# Patient Record
Sex: Male | Born: 1940 | Race: Black or African American | Hispanic: No | Marital: Married | Smoking: Never smoker
Health system: Southern US, Community
[De-identification: ages and names within clinical notes are randomized; demographics above are authoritative.]

## PROBLEM LIST (undated history)

## (undated) DIAGNOSIS — R519 Headache, unspecified: Secondary | ICD-10-CM

## (undated) DIAGNOSIS — M4854XA Collapsed vertebra, not elsewhere classified, thoracic region, initial encounter for fracture: Secondary | ICD-10-CM

## (undated) DIAGNOSIS — N400 Enlarged prostate without lower urinary tract symptoms: Secondary | ICD-10-CM

## (undated) DIAGNOSIS — D471 Chronic myeloproliferative disease: Secondary | ICD-10-CM

## (undated) DIAGNOSIS — M542 Cervicalgia: Secondary | ICD-10-CM

## (undated) DIAGNOSIS — D75839 Thrombocytosis, unspecified: Secondary | ICD-10-CM

## (undated) DIAGNOSIS — G629 Polyneuropathy, unspecified: Secondary | ICD-10-CM

## (undated) DIAGNOSIS — K219 Gastro-esophageal reflux disease without esophagitis: Secondary | ICD-10-CM

## (undated) DIAGNOSIS — R42 Dizziness and giddiness: Secondary | ICD-10-CM

## (undated) DIAGNOSIS — D45 Polycythemia vera: Secondary | ICD-10-CM

## (undated) DIAGNOSIS — I10 Essential (primary) hypertension: Secondary | ICD-10-CM

## (undated) DIAGNOSIS — F4310 Post-traumatic stress disorder, unspecified: Secondary | ICD-10-CM

## (undated) HISTORY — DX: Chronic myeloproliferative disease: D47.1

## (undated) HISTORY — PX: JOINT REPLACEMENT: SHX530

## (undated) HISTORY — DX: Headache, unspecified: R51.9

## (undated) HISTORY — PX: BACK SURGERY: SHX140

## (undated) HISTORY — DX: Benign prostatic hyperplasia without lower urinary tract symptoms: N40.0

## (undated) HISTORY — DX: Dizziness and giddiness: R42

## (undated) HISTORY — DX: Cervicalgia: M54.2

## (undated) HISTORY — DX: Post-traumatic stress disorder, unspecified: F43.10

## (undated) HISTORY — DX: Thrombocytosis, unspecified: D75.839

## (undated) HISTORY — DX: Collapsed vertebra, not elsewhere classified, thoracic region, initial encounter for fracture: M48.54XA

---

## 2006-10-20 ENCOUNTER — Ambulatory Visit: Payer: Self-pay | Admitting: Internal Medicine

## 2006-11-03 ENCOUNTER — Ambulatory Visit: Payer: Self-pay | Admitting: Internal Medicine

## 2006-11-03 ENCOUNTER — Encounter: Payer: Self-pay | Admitting: Internal Medicine

## 2006-12-19 ENCOUNTER — Encounter: Admission: RE | Admit: 2006-12-19 | Discharge: 2006-12-19 | Payer: Self-pay

## 2007-06-06 ENCOUNTER — Emergency Department (HOSPITAL_COMMUNITY): Admission: EM | Admit: 2007-06-06 | Discharge: 2007-06-06 | Payer: Self-pay | Admitting: Emergency Medicine

## 2007-09-15 ENCOUNTER — Emergency Department (HOSPITAL_COMMUNITY): Admission: EM | Admit: 2007-09-15 | Discharge: 2007-09-16 | Payer: Self-pay | Admitting: Emergency Medicine

## 2008-04-12 ENCOUNTER — Encounter: Admission: RE | Admit: 2008-04-12 | Discharge: 2008-04-12 | Payer: Self-pay | Admitting: *Deleted

## 2010-01-25 ENCOUNTER — Encounter: Payer: Self-pay | Admitting: *Deleted

## 2010-02-15 ENCOUNTER — Emergency Department (HOSPITAL_COMMUNITY)
Admission: EM | Admit: 2010-02-15 | Discharge: 2010-02-15 | Disposition: A | Discharge status: Home / Self Care | Payer: Non-veteran care | Attending: Emergency Medicine | Admitting: Emergency Medicine

## 2010-02-15 ENCOUNTER — Emergency Department (HOSPITAL_COMMUNITY): Payer: Non-veteran care

## 2010-02-15 DIAGNOSIS — M25569 Pain in unspecified knee: Secondary | ICD-10-CM | POA: Insufficient documentation

## 2010-02-15 DIAGNOSIS — M545 Low back pain, unspecified: Secondary | ICD-10-CM | POA: Insufficient documentation

## 2010-02-15 DIAGNOSIS — W010XXA Fall on same level from slipping, tripping and stumbling without subsequent striking against object, initial encounter: Secondary | ICD-10-CM | POA: Insufficient documentation

## 2010-02-15 DIAGNOSIS — I10 Essential (primary) hypertension: Secondary | ICD-10-CM | POA: Insufficient documentation

## 2010-02-15 DIAGNOSIS — Y92009 Unspecified place in unspecified non-institutional (private) residence as the place of occurrence of the external cause: Secondary | ICD-10-CM | POA: Insufficient documentation

## 2010-02-15 DIAGNOSIS — D45 Polycythemia vera: Secondary | ICD-10-CM | POA: Insufficient documentation

## 2010-02-15 DIAGNOSIS — R51 Headache: Secondary | ICD-10-CM | POA: Insufficient documentation

## 2010-02-15 DIAGNOSIS — S0990XA Unspecified injury of head, initial encounter: Secondary | ICD-10-CM | POA: Insufficient documentation

## 2010-02-15 DIAGNOSIS — M549 Dorsalgia, unspecified: Secondary | ICD-10-CM | POA: Insufficient documentation

## 2010-02-15 DIAGNOSIS — M25519 Pain in unspecified shoulder: Secondary | ICD-10-CM | POA: Insufficient documentation

## 2010-10-01 LAB — URINALYSIS, ROUTINE W REFLEX MICROSCOPIC
Glucose, UA: NEGATIVE
Ketones, ur: NEGATIVE
Leukocytes, UA: NEGATIVE
Nitrite: NEGATIVE
Specific Gravity, Urine: 1.015
pH: 6.5

## 2010-10-01 LAB — DIFFERENTIAL
Basophils Absolute: 0
Basophils Relative: 0
Eosinophils Absolute: 0.1
Lymphs Abs: 0.8
Monocytes Absolute: 0.5
Monocytes Relative: 6
Neutro Abs: 6.3

## 2010-10-01 LAB — CBC
Hemoglobin: 15.9
WBC: 7.6

## 2010-10-01 LAB — POCT I-STAT, CHEM 8
BUN: 9
Calcium, Ion: 1.14
Chloride: 104
Glucose, Bld: 98
HCT: 49
Potassium: 3.4 — ABNORMAL LOW

## 2010-10-01 LAB — POCT CARDIAC MARKERS
CKMB, poc: 7.6
Troponin i, poc: <0.05

## 2011-03-31 ENCOUNTER — Telehealth: Payer: Self-pay | Admitting: *Deleted

## 2011-03-31 NOTE — Telephone Encounter (Signed)
Opened in error

## 2011-07-09 ENCOUNTER — Encounter (HOSPITAL_COMMUNITY): Payer: Self-pay | Admitting: *Deleted

## 2011-07-09 ENCOUNTER — Emergency Department (HOSPITAL_COMMUNITY)
Admission: EM | Admit: 2011-07-09 | Discharge: 2011-07-10 | Disposition: A | Discharge status: Home / Self Care | Payer: Non-veteran care | Attending: Emergency Medicine | Admitting: Emergency Medicine

## 2011-07-09 ENCOUNTER — Emergency Department (HOSPITAL_COMMUNITY): Payer: Non-veteran care

## 2011-07-09 DIAGNOSIS — R079 Chest pain, unspecified: Secondary | ICD-10-CM | POA: Insufficient documentation

## 2011-07-09 DIAGNOSIS — S2239XA Fracture of one rib, unspecified side, initial encounter for closed fracture: Secondary | ICD-10-CM | POA: Insufficient documentation

## 2011-07-09 DIAGNOSIS — I10 Essential (primary) hypertension: Secondary | ICD-10-CM | POA: Insufficient documentation

## 2011-07-09 DIAGNOSIS — S2231XA Fracture of one rib, right side, initial encounter for closed fracture: Secondary | ICD-10-CM

## 2011-07-09 DIAGNOSIS — S93409A Sprain of unspecified ligament of unspecified ankle, initial encounter: Secondary | ICD-10-CM

## 2011-07-09 DIAGNOSIS — M25579 Pain in unspecified ankle and joints of unspecified foot: Secondary | ICD-10-CM | POA: Insufficient documentation

## 2011-07-09 HISTORY — DX: Essential (primary) hypertension: I10

## 2011-07-09 MED ORDER — IBUPROFEN 800 MG PO TABS
800.0000 mg | ORAL_TABLET | Freq: Once | ORAL | Status: AC
  Administered 2011-07-09: 800 mg via ORAL
  Filled 2011-07-09: qty 1

## 2011-07-09 NOTE — ED Notes (Signed)
Patient transported to X-ray 
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
## 2011-07-09 NOTE — ED Notes (Signed)
EAV:[id number]> Expected date:07/09/11<BR> Expected time:10:42 PM<BR> Means of arrival:Ambulance<BR> Comments:<BR> Anxiety; abd pain

## 2011-07-10 ENCOUNTER — Emergency Department (HOSPITAL_COMMUNITY): Payer: Non-veteran care

## 2011-07-10 MED ORDER — IBUPROFEN 800 MG PO TABS: 800.0000 mg | ORAL_TABLET | Freq: Three times a day (TID) | ORAL | Status: DC

## 2011-07-10 MED ORDER — HYDROCODONE-ACETAMINOPHEN 5-500 MG PO TABS: 1.0000 | ORAL_TABLET | Freq: Four times a day (QID) | ORAL | Status: DC | PRN

## 2011-07-10 NOTE — ED Notes (Signed)
EKG was complete at 22:58

## 2011-07-12 ENCOUNTER — Encounter (HOSPITAL_COMMUNITY): Payer: Self-pay | Admitting: *Deleted

## 2011-07-12 ENCOUNTER — Emergency Department (HOSPITAL_COMMUNITY): Payer: No Typology Code available for payment source

## 2011-07-12 ENCOUNTER — Emergency Department (HOSPITAL_COMMUNITY)
Admission: EM | Admit: 2011-07-12 | Discharge: 2011-07-13 | Disposition: A | Discharge status: Home / Self Care | Payer: No Typology Code available for payment source | Attending: Emergency Medicine | Admitting: Emergency Medicine

## 2011-07-12 DIAGNOSIS — J9819 Other pulmonary collapse: Secondary | ICD-10-CM | POA: Insufficient documentation

## 2011-07-12 DIAGNOSIS — J9811 Atelectasis: Secondary | ICD-10-CM

## 2011-07-12 DIAGNOSIS — I10 Essential (primary) hypertension: Secondary | ICD-10-CM | POA: Insufficient documentation

## 2011-07-12 DIAGNOSIS — R0602 Shortness of breath: Secondary | ICD-10-CM | POA: Diagnosis present

## 2011-07-12 DIAGNOSIS — R079 Chest pain, unspecified: Secondary | ICD-10-CM | POA: Insufficient documentation

## 2011-07-12 MED ORDER — OXYCODONE-ACETAMINOPHEN 5-325 MG PO TABS
2.0000 | ORAL_TABLET | Freq: Once | ORAL | Status: AC
  Administered 2011-07-12: 2 via ORAL
  Filled 2011-07-12: qty 2
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
## 2011-07-13 MED ORDER — OXYCODONE-ACETAMINOPHEN 5-325 MG PO TABS: 1.0000 | ORAL_TABLET | ORAL | Status: AC | PRN
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
## 2011-07-13 NOTE — Progress Notes (Signed)
Incentive spirometer instructions given, Pt stable at discharge. No issues. Prescriptions reviewed

## 2011-07-30 ENCOUNTER — Encounter (HOSPITAL_COMMUNITY): Payer: Self-pay | Admitting: *Deleted

## 2011-07-30 ENCOUNTER — Emergency Department (HOSPITAL_COMMUNITY)
Admission: EM | Admit: 2011-07-30 | Discharge: 2011-07-30 | Disposition: A | Discharge status: Home / Self Care | Payer: No Typology Code available for payment source | Attending: Emergency Medicine | Admitting: Emergency Medicine

## 2011-07-30 ENCOUNTER — Emergency Department (HOSPITAL_COMMUNITY): Payer: No Typology Code available for payment source

## 2011-07-30 DIAGNOSIS — Z79899 Other long term (current) drug therapy: Secondary | ICD-10-CM | POA: Diagnosis not present

## 2011-07-30 DIAGNOSIS — R079 Chest pain, unspecified: Secondary | ICD-10-CM | POA: Insufficient documentation

## 2011-07-30 DIAGNOSIS — Z7982 Long term (current) use of aspirin: Secondary | ICD-10-CM | POA: Insufficient documentation

## 2011-07-30 DIAGNOSIS — I10 Essential (primary) hypertension: Secondary | ICD-10-CM | POA: Insufficient documentation

## 2011-07-30 MED ORDER — GI COCKTAIL ~~LOC~~
30.0000 mL | Freq: Once | ORAL | Status: AC
  Administered 2011-07-30: 30 mL via ORAL
  Filled 2011-07-30: qty 30

## 2011-07-30 MED ORDER — PANTOPRAZOLE SODIUM 40 MG PO TBEC: 40.0000 mg | DELAYED_RELEASE_TABLET | Freq: Every day | ORAL | Status: AC

## 2011-07-30 MED ORDER — OXYCODONE-ACETAMINOPHEN 5-325 MG PO TABS
2.0000 | ORAL_TABLET | Freq: Once | ORAL | Status: AC
  Administered 2011-07-30: 2 via ORAL
  Filled 2011-07-30: qty 2
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
## 2011-07-30 NOTE — ED Notes (Signed)
Pt also reports he has been belching "alot, like my food gets stuck or something."
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
## 2011-08-10 ENCOUNTER — Other Ambulatory Visit (HOSPITAL_COMMUNITY): Payer: Self-pay | Admitting: Chiropractic Medicine

## 2011-08-10 DIAGNOSIS — S43429A Sprain of unspecified rotator cuff capsule, initial encounter: Secondary | ICD-10-CM

## 2011-08-11 ENCOUNTER — Ambulatory Visit (HOSPITAL_COMMUNITY)
Admission: RE | Admit: 2011-08-11 | Discharge: 2011-08-11 | Disposition: A | Discharge status: Home / Self Care | Payer: No Typology Code available for payment source | Source: Ambulatory Visit | Attending: Chiropractic Medicine | Admitting: Chiropractic Medicine

## 2011-08-11 DIAGNOSIS — M67919 Unspecified disorder of synovium and tendon, unspecified shoulder: Secondary | ICD-10-CM | POA: Insufficient documentation

## 2011-08-11 DIAGNOSIS — M719 Bursopathy, unspecified: Secondary | ICD-10-CM | POA: Insufficient documentation

## 2011-08-11 DIAGNOSIS — M24119 Other articular cartilage disorders, unspecified shoulder: Secondary | ICD-10-CM | POA: Insufficient documentation

## 2011-08-11 DIAGNOSIS — M25519 Pain in unspecified shoulder: Secondary | ICD-10-CM | POA: Insufficient documentation

## 2011-08-11 DIAGNOSIS — S43429A Sprain of unspecified rotator cuff capsule, initial encounter: Secondary | ICD-10-CM

## 2011-08-11 DIAGNOSIS — R29898 Other symptoms and signs involving the musculoskeletal system: Secondary | ICD-10-CM | POA: Insufficient documentation

## 2011-08-12 ENCOUNTER — Inpatient Hospital Stay (HOSPITAL_COMMUNITY)
Admission: RE | Admit: 2011-08-12 | Discharge: 2011-08-12 | Payer: Non-veteran care | Source: Ambulatory Visit | Attending: Chiropractic Medicine | Admitting: Chiropractic Medicine

## 2011-09-20 ENCOUNTER — Encounter: Payer: Self-pay | Admitting: Internal Medicine

## 2012-05-04 ENCOUNTER — Encounter: Payer: Self-pay | Admitting: Internal Medicine
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
## 2015-04-12 ENCOUNTER — Encounter (HOSPITAL_COMMUNITY): Payer: Self-pay | Admitting: Oncology

## 2015-04-12 ENCOUNTER — Emergency Department (HOSPITAL_COMMUNITY): Payer: Non-veteran care

## 2015-04-12 ENCOUNTER — Emergency Department (HOSPITAL_COMMUNITY)
Admission: EM | Admit: 2015-04-12 | Discharge: 2015-04-12 | Disposition: A | Discharge status: Home / Self Care | Payer: Non-veteran care | Attending: Emergency Medicine | Admitting: Emergency Medicine

## 2015-04-12 DIAGNOSIS — M4806 Spinal stenosis, lumbar region: Secondary | ICD-10-CM | POA: Insufficient documentation

## 2015-04-12 DIAGNOSIS — Z7982 Long term (current) use of aspirin: Secondary | ICD-10-CM | POA: Insufficient documentation

## 2015-04-12 DIAGNOSIS — Z79899 Other long term (current) drug therapy: Secondary | ICD-10-CM | POA: Insufficient documentation

## 2015-04-12 DIAGNOSIS — I10 Essential (primary) hypertension: Secondary | ICD-10-CM | POA: Insufficient documentation

## 2015-04-12 DIAGNOSIS — R202 Paresthesia of skin: Secondary | ICD-10-CM | POA: Insufficient documentation

## 2015-04-12 DIAGNOSIS — R200 Anesthesia of skin: Secondary | ICD-10-CM | POA: Insufficient documentation

## 2015-04-12 DIAGNOSIS — M48061 Spinal stenosis, lumbar region without neurogenic claudication: Secondary | ICD-10-CM

## 2015-04-12 NOTE — ED Provider Notes (Signed)
CSN: [id number]     Arrival date & time 04/12/15  1921 History   First MD Initiated Contact with Patient 04/12/15 2120     Chief Complaint  Patient presents with  . Right foot numbness      (Consider location/radiation/quality/duration/timing/severity/associated sxs/prior Treatment) HPI Comments: This is 75 year old male who states for the past week he has had numbness and tingling in his right foot at the base of the fourth and fifth toes that is spreading to the lateral foot and up into his shin.  He was seen by the [facility] last week and set up with an appointment with the podiatrist for June 5.  They told him to take a bottle of water and roll his foot over it to help exercises.  He does have a history of plantar fasciitis in his left foot and this feels different.  He also states that several days ago he had pain in his buttock and hip. He denies any trauma, is not a diabetic.  He does not have any ingrown nails  The history is provided by the patient.    Past Medical History  Diagnosis Date  . Hypertension    History reviewed. No pertinent past surgical history. No family history on file. Social History  Substance Use Topics  . Smoking status: Never Smoker   . Smokeless tobacco: None  . Alcohol Use: No    Review of Systems  Constitutional: Negative for fever.  Respiratory: Negative for shortness of breath.   Cardiovascular: Negative for chest pain.  Gastrointestinal: Negative for abdominal pain.  Genitourinary: Negative for dysuria.  Musculoskeletal: Negative for back pain.  Skin: Negative for rash and wound.  Neurological: Positive for numbness.  All other systems reviewed and are negative.     Allergies  Review of patient's allergies indicates no known allergies.  Home Medications   Prior to Admission medications   Medication Sig Start Date End Date Taking? Authorizing Provider  acetaminophen (TYLENOL) 500 MG tablet Take 1,000 mg by mouth 2 (two) times daily as  needed. For pain.    Historical Provider, MD  aspirin 325 MG tablet Take 325 mg by mouth daily.    Historical Provider, MD  cholecalciferol (VITAMIN D) 1000 UNITS tablet Take 1,000 Units by mouth daily.    Historical Provider, MD  docusate sodium (COLACE) 100 MG capsule Take 200 mg by mouth 2 (two) times daily.    Historical Provider, MD  hydrochlorothiazide (HYDRODIURIL) 25 MG tablet Take 25 mg by mouth daily.    Historical Provider, MD  hydroxyurea (HYDREA) 500 MG capsule Take 500-1,000 mg by mouth daily. May take with food to minimize GI side effects; 1 cap daily except for 2 caps daily on Saturday and Sunday.    Historical Provider, MD  Multiple Vitamin (MULTIVITAMIN WITH MINERALS) TABS Take 1 tablet by mouth daily.    Historical Provider, MD  NIFEdipine (PROCARDIA XL/ADALAT-CC) 60 MG 24 hr tablet Take 60 mg by mouth daily.    Historical Provider, MD  oxyCODONE-acetaminophen (PERCOCET/ROXICET) 5-325 MG per tablet Take 1 tablet by mouth every 4 (four) hours as needed. For pain.    Historical Provider, MD  pantoprazole (PROTONIX) 40 MG tablet Take 1 tablet (40 mg total) by mouth daily. 07/30/11 07/29/12  Virgel Manifold, MD   BP 135/75 mmHg  Pulse 88  Temp(Src) 97.8 F (36.6 C) (Oral)  Resp 18  SpO2 100% Physical Exam  Constitutional: He is oriented to person, place, and time. He appears well-developed and well-nourished.  HENT:  Head: Normocephalic.  Eyes: Pupils are equal, round, and reactive to light.  Neck: Normal range of motion.  Cardiovascular: Normal rate and regular rhythm.   Pulmonary/Chest: Effort normal and breath sounds normal.  Abdominal: Soft. He exhibits no distension. There is no tenderness.  Musculoskeletal: He exhibits no edema or tenderness.  Neurological: He is alert and oriented to person, place, and time. He has normal strength. He displays a negative Romberg sign.  Decreased sensation lateral R foot radiating to mid calf.  No change in temperature of the skin.  No  rash, discoloration   Skin: Skin is warm.  Psychiatric: He has a normal mood and affect.  Nursing note and vitals reviewed.   ED Course  Procedures (including critical care time) Labs Review Labs Reviewed - No data to display  Imaging Review Ct Lumbar Spine Wo Contrast  04/12/2015  CLINICAL DATA:  Low back pain radiating to feet for 1 week. EXAM: CT LUMBAR SPINE WITHOUT CONTRAST TECHNIQUE: Multidetector CT imaging of the lumbar spine was performed without intravenous contrast administration. Multiplanar CT image reconstructions were also generated. COMPARISON:  Lumbar spine radiographs February 12th 2012 and MRI of the lumbar spine April 12, 2008 FINDINGS: Using previously described reference levels, moderate to severe stable T 12 and moderate L1 superior endplate compression fractures. The remaining vertebral bodies are intact with maintenance of lumbar lordosis. Mild broad levoscoliosis on this nonweightbearing examination. Stable grade 1 L3-4 anterolisthesis. No spondylolysis. Severe L3-4 and L5-S1 disc height loss with vacuum disc, moderate at L4-5 compatible with degenerative discs. No destructive bony lesions. Mild calcific atherosclerosis of the included aortoiliac vessels. Level by level evaluation: T12 and L1: No significant disc bulge, canal stenosis or osseous neural foraminal narrowing. L2-3: Moderate broad-based disc osteophyte complex. Moderate facet arthropathy and ligamentum flavum redundancy. Similar moderate canal stenosis and mild bilateral neural foraminal narrowing. L3-4: Anterolisthesis. Moderate broad-based disc osteophyte complex. Moderate to severe facet arthropathy and ligamentum flavum redundancy result in severe canal stenosis, similar to prior MRI. Moderate to severe RIGHT, mild LEFT neural foraminal narrowing. L4-5: Status post RIGHT hemilaminectomy. Small broad-based disc osteophyte complex and mild facet arthropathy without canal stenosis. Moderate RIGHT, mild to moderate  LEFT neural foraminal narrowing. L5-S1: Small broad-based disc osteophyte complex. Mild facet arthropathy and ligamentum flavum redundancy without canal stenosis. Moderate RIGHT, severe LEFT neural foraminal narrowing. IMPRESSION: No acute lumbar spine fracture. Old moderate to severe T12 and moderate L1 compression fractures. Stable grade 1 L3-4 anterolisthesis on degenerative basis. Likely component of adjacent segment disease considering RIGHT L4 hemilaminectomy. Similar severe canal stenosis at L3-4, moderate at L2-3. Similar neural foraminal narrowing L2-3 through L5-S1: Severe on the LEFT at L5-S1. Electronically Signed   By: Elon Alas M.D.   On: 04/12/2015 22:30   I have personally reviewed and evaluated these images and lab results as part of my medical decision-making.   EKG Interpretation None     Based on the chronicity of the above CT scan readings.  Patient has been referred back to his [place] doctor MDM   Final diagnoses:  Spinal stenosis of lumbar region at multiple levels  Paresthesia of right foot         Junius Creamer, NP 04/12/15 2256  Lacretia Leigh, MD 04/16/15 [phone]

## 2015-04-12 NOTE — ED Notes (Signed)
Pt c/o right foot numbness that is beginning to radiate up his calf x 1 week.  Was seen at the [facility] last week and set up w/ an appointment for a podiatrist on June 5th.  Pt reports no falls and ambulating at baseline.  Rates pain 8/10.

## 2015-04-12 NOTE — Discharge Instructions (Signed)
The CT scan of your back shows that you have extensive spinal stenosis at multiple levels.  This is been compared to a previous MRI and CT scan that you've had.  There are no fractures.  Please make an appointment with your physician at the [facility] for further evaluation.

## 2016-05-22 ENCOUNTER — Emergency Department (HOSPITAL_COMMUNITY): Payer: Non-veteran care

## 2016-05-22 ENCOUNTER — Emergency Department (HOSPITAL_COMMUNITY)
Admission: EM | Admit: 2016-05-22 | Discharge: 2016-05-23 | Disposition: A | Discharge status: Home / Self Care | Payer: Non-veteran care | Attending: Emergency Medicine | Admitting: Emergency Medicine

## 2016-05-22 ENCOUNTER — Encounter (HOSPITAL_COMMUNITY): Payer: Self-pay | Admitting: Oncology

## 2016-05-22 DIAGNOSIS — K3184 Gastroparesis: Secondary | ICD-10-CM | POA: Diagnosis not present

## 2016-05-22 DIAGNOSIS — I10 Essential (primary) hypertension: Secondary | ICD-10-CM | POA: Insufficient documentation

## 2016-05-22 DIAGNOSIS — Z96651 Presence of right artificial knee joint: Secondary | ICD-10-CM | POA: Insufficient documentation

## 2016-05-22 DIAGNOSIS — Z79899 Other long term (current) drug therapy: Secondary | ICD-10-CM | POA: Insufficient documentation

## 2016-05-22 DIAGNOSIS — R109 Unspecified abdominal pain: Secondary | ICD-10-CM | POA: Diagnosis present

## 2016-05-22 DIAGNOSIS — R1084 Generalized abdominal pain: Secondary | ICD-10-CM

## 2016-05-22 HISTORY — DX: Gastro-esophageal reflux disease without esophagitis: K21.9

## 2016-05-22 LAB — CBC
HEMATOCRIT: 36.3 % — AB (ref 39.0–52.0)
Hemoglobin: 11.5 g/dL — ABNORMAL LOW (ref 13.0–17.0)
MCH: 26.9 pg (ref 26.0–34.0)
MCHC: 31.7 g/dL (ref 30.0–36.0)
MCV: 85 fL (ref 78.0–100.0)
Platelets: 487 10*3/uL — ABNORMAL HIGH (ref 150–400)
RBC: 4.27 MIL/uL (ref 4.22–5.81)
RDW: 17.6 % — AB (ref 11.5–15.5)
WBC: 7.4 10*3/uL (ref 4.0–10.5)

## 2016-05-22 LAB — I-STAT CHEM 8, ED
BUN: 8 mg/dL (ref 6–20)
CHLORIDE: 100 mmol/L — AB (ref 101–111)
CREATININE: 1 mg/dL (ref 0.61–1.24)
Calcium, Ion: 1.14 mmol/L — ABNORMAL LOW (ref 1.15–1.40)
GLUCOSE: 91 mg/dL (ref 65–99)
HCT: 36 % — ABNORMAL LOW (ref 39.0–52.0)
Hemoglobin: 12.2 g/dL — ABNORMAL LOW (ref 13.0–17.0)
POTASSIUM: 3.6 mmol/L (ref 3.5–5.1)
Sodium: 139 mmol/L (ref 135–145)
TCO2: 29 mmol/L (ref 0–100)

## 2016-05-22 LAB — COMPREHENSIVE METABOLIC PANEL
ALT: 14 U/L — ABNORMAL LOW (ref 17–63)
AST: 20 U/L (ref 15–41)
Albumin: 4.2 g/dL (ref 3.5–5.0)
Alkaline Phosphatase: 47 U/L (ref 38–126)
Anion gap: 8 (ref 5–15)
BUN: 7 mg/dL (ref 6–20)
CHLORIDE: 103 mmol/L (ref 101–111)
CO2: 27 mmol/L (ref 22–32)
Calcium: 9 mg/dL (ref 8.9–10.3)
Creatinine, Ser: 1.04 mg/dL (ref 0.61–1.24)
Glucose, Bld: 89 mg/dL (ref 65–99)
Potassium: 3.6 mmol/L (ref 3.5–5.1)
Sodium: 138 mmol/L (ref 135–145)
Total Bilirubin: 0.4 mg/dL (ref 0.3–1.2)
Total Protein: 6.3 g/dL — ABNORMAL LOW (ref 6.5–8.1)

## 2016-05-22 LAB — LIPASE, BLOOD: LIPASE: 26 U/L (ref 11–51)

## 2016-05-22 MED ORDER — SODIUM CHLORIDE 0.9 % IV BOLUS (SEPSIS)
1000.0000 mL | Freq: Once | INTRAVENOUS | Status: AC
  Administered 2016-05-22: 1000 mL via INTRAVENOUS

## 2016-05-22 MED ORDER — IOPAMIDOL (ISOVUE-300) INJECTION 61%
INTRAVENOUS | Status: AC
  Administered 2016-05-23
  Filled 2016-05-22: qty 100

## 2016-05-22 MED ORDER — METOCLOPRAMIDE HCL 5 MG/ML IJ SOLN
10.0000 mg | Freq: Once | INTRAMUSCULAR | Status: AC
  Administered 2016-05-22: 10 mg via INTRAVENOUS
  Filled 2016-05-22: qty 2

## 2016-05-22 MED ORDER — IOPAMIDOL (ISOVUE-300) INJECTION 61%
100.0000 mL | Freq: Once | INTRAVENOUS | Status: AC | PRN
  Administered 2016-05-22: 100 mL via INTRAVENOUS

## 2016-05-22 MED ORDER — SODIUM CHLORIDE 0.9 % IV SOLN
INTRAVENOUS | Status: DC
  Administered 2016-05-22: 22:00:00 via INTRAVENOUS

## 2016-05-22 NOTE — ED Notes (Signed)
Patient aware a urine sample is needed and has a urinal at bedside.

## 2016-05-22 NOTE — ED Provider Notes (Signed)
[name] DEPT Provider Note   CSN: [id number] Arrival date & time: 05/22/16  2101     History   Chief Complaint Chief Complaint  Patient presents with  . Abdominal Pain    HPI Steve Miller is a 76 y.o. male.  Pt presents to the ED today with abdominal pain, poor po intake, and weight loss.  The pt said that he feels like food gets stuck in his esophagus.  He has had a barium swallow that showed that the food does get stuck, but pt has not seen GI.        Past Medical History:  Diagnosis Date  . GERD (gastroesophageal reflux disease)   . Hypertension     There are no active problems to display for this patient.   Past Surgical History:  Procedure Laterality Date  . BACK SURGERY    . JOINT REPLACEMENT Right    knee       Home Medications    Prior to Admission medications   Medication Sig Start Date End Date Taking? Authorizing Provider  Ascorbic Acid (VITAMIN C PO) Take 1 tablet by mouth daily.   Yes [provider]  cholecalciferol (VITAMIN D) 1000 UNITS tablet Take 1,000 Units by mouth daily.   Yes [provider]  docusate sodium (COLACE) 100 MG capsule Take 200 mg by mouth 2 (two) times daily.   Yes [provider]  hydrochlorothiazide (HYDRODIURIL) 25 MG tablet Take 25 mg by mouth daily.   Yes [provider]  Multiple Vitamin (MULTIVITAMIN WITH MINERALS) TABS Take 1 tablet by mouth daily.   Yes [provider]  omeprazole (PRILOSEC) 40 MG capsule Take 40 mg by mouth daily.   Yes [provider]  pantoprazole (PROTONIX) 40 MG tablet Take 40 mg by mouth daily.   Yes [provider]  ruxolitinib phosphate (JAKAFI) 10 MG tablet Take 10 mg by mouth at bedtime.   Yes [provider]  ruxolitinib phosphate (JAKAFI) 20 MG tablet Take 20 mg by mouth daily.   Yes [provider]  tamsulosin (FLOMAX) 0.4 MG CAPS capsule Take 0.4 mg by mouth daily after supper.   Yes [provider]  acetaminophen (TYLENOL) 500 MG tablet Take 1,000 mg by mouth 2 (two) times daily as needed. For pain.    [provider]  metoCLOPramide (REGLAN) 10 MG tablet Take 1 tablet (10 mg total) by mouth every 6 (six) hours. 05/23/16   Isla Pence, MD  pantoprazole (PROTONIX) 40 MG tablet Take 1 tablet (40 mg total) by mouth daily. 07/30/11 07/29/12  Virgel Manifold, MD    Family History No family history on file.  Social History Social History  Substance Use Topics  . Smoking status: Never Smoker  . Smokeless tobacco: Never Used  . Alcohol use No     Allergies   Patient has no known allergies.   Review of Systems Review of Systems  Gastrointestinal: Positive for abdominal pain.  All other systems reviewed and are negative.    Physical Exam Updated Vital Signs BP (!) 144/80 (BP Location: Right Arm)   Pulse 66   Temp 98.2 F (36.8 C) (Oral)   Resp 11   Ht 5\' 7"  (1.702 m)   Wt 72.6 kg (160 lb)   SpO2 99%   BMI 25.06 kg/m   Physical Exam  Constitutional: He is oriented to person, place, and time. He appears well-developed and well-nourished.  HENT:  Head: Normocephalic and atraumatic.  Right Ear: External  ear normal.  Left Ear: External ear normal.  Nose: Nose normal.  Mouth/Throat: Oropharynx is clear and moist.  Eyes: Conjunctivae and EOM are normal. Pupils are equal, round, and reactive to light.  Neck: Normal range of motion. Neck supple.  Cardiovascular: Normal rate, regular rhythm, normal heart sounds and intact distal pulses.   Pulmonary/Chest: Effort normal and breath sounds normal.  Abdominal: Soft. Bowel sounds are normal. There is tenderness in the epigastric area.  Musculoskeletal: Normal range of motion.  Neurological: He is alert and oriented to person, place, and time.  Skin: Skin is warm.  Psychiatric: He has a normal mood and affect. His behavior is normal. Judgment and thought content normal.  Nursing note and vitals  reviewed.    ED Treatments / Results  Labs (all labs ordered are listed, but only abnormal results are displayed) Labs Reviewed  COMPREHENSIVE METABOLIC PANEL - Abnormal; Notable for the following:       Result Value   Total Protein 6.3 (*)    ALT 14 (*)    All other components within normal limits  CBC - Abnormal; Notable for the following:    Hemoglobin 11.5 (*)    HCT 36.3 (*)    RDW 17.6 (*)    Platelets 487 (*)    All other components within normal limits  I-STAT CHEM 8, ED - Abnormal; Notable for the following:    Chloride 100 (*)    Calcium, Ion 1.14 (*)    Hemoglobin 12.2 (*)    HCT 36.0 (*)    All other components within normal limits  LIPASE, BLOOD  URINALYSIS, ROUTINE W REFLEX MICROSCOPIC  TROPONIN I    EKG  EKG Interpretation  Date/Time:  Sunday May 23 2016 00:33:37 EDT Ventricular Rate:  69 PR Interval:    QRS Duration: 95 QT Interval:  422 QTC Calculation: 453 R Axis:   37 Text Interpretation:  Sinus rhythm Borderline low voltage, extremity leads No significant change since last tracing Confirmed by Ripley Fraise [phone]) on 05/23/2016 12:43:05 AM Also confirmed by Ripley Fraise [phone]), editor Drema Pry [phone])  on 05/23/2016 8:51:43 AM       Radiology No results found.  Procedures Procedures (including critical care time)  Medications Ordered in ED Medications  metoCLOPramide (REGLAN) injection 10 mg (10 mg Intravenous Given 05/22/16 2220)  sodium chloride 0.9 % bolus 1,000 mL (0 mLs Intravenous Stopped 05/22/16 2319)  iopamidol (ISOVUE-300) 61 % injection 100 mL (100 mLs Intravenous Contrast Given 05/22/16 2321)  iopamidol (ISOVUE-300) 61 % injection (  Contrast Given 05/23/16 0004)     Initial Impression / Assessment and Plan / ED Course  I have reviewed the triage vital signs and the nursing notes.  Pertinent labs & imaging results that were available during my care of the patient were reviewed by me and considered in my  medical decision making (see chart for details).    Pt signed out to Dr. Christy Gentles pending labs and CT results.  Pt knows to return if worse.  F/u with pcp and with GI.  Final Clinical Impressions(s) / ED Diagnoses   Final diagnoses:  Generalized abdominal pain  Gastroparesis    New Prescriptions Discharge Medication List as of 05/23/2016  1:47 AM    START taking these medications   Details  metoCLOPramide (REGLAN) 10 MG tablet Take 1 tablet (10 mg total) by mouth every 6 (six) hours., Starting Sun 05/23/2016, Print         Isla Pence, MD 05/25/16  1206  

## 2016-05-22 NOTE — ED Triage Notes (Signed)
Pt c/o abdominal pain, poor po intake, diarrhea, bad taste in his mouth, feels as if food gets stuck in epigastric area and 6-7 lb weight, loss over the last month.  Pt states he called the [facility] and was instructed to come to the ED.  Per pt he was in a rehab facility after a right knee replacement and had a barium swallow study done which showed that food was getting stuck and taking more time to reach his stomach.

## 2016-05-23 LAB — URINALYSIS, ROUTINE W REFLEX MICROSCOPIC
Bilirubin Urine: NEGATIVE
Glucose, UA: NEGATIVE mg/dL
Hgb urine dipstick: NEGATIVE
Ketones, ur: NEGATIVE mg/dL
LEUKOCYTES UA: NEGATIVE
NITRITE: NEGATIVE
Protein, ur: NEGATIVE mg/dL
SPECIFIC GRAVITY, URINE: 1.01 (ref 1.005–1.030)
pH: 7 (ref 5.0–8.0)

## 2016-05-23 LAB — TROPONIN I: Troponin I: <0.03 ng/mL (ref <0.03)

## 2016-05-23 MED ORDER — METOCLOPRAMIDE HCL 10 MG PO TABS: 10.0000 mg | ORAL_TABLET | Freq: Four times a day (QID) | ORAL | 0 refills | Status: AC

## 2016-05-23 NOTE — ED Provider Notes (Signed)
I assumed care at signout Imaging/labs unremarkable Pt well appearing He is requesting d/c home reglan ordered GI referral given    Ripley Fraise, MD 05/23/16 [phone]

## 2016-06-28 ENCOUNTER — Ambulatory Visit: Payer: Self-pay | Admitting: Podiatry

## 2016-07-12 ENCOUNTER — Ambulatory Visit (INDEPENDENT_AMBULATORY_CARE_PROVIDER_SITE_OTHER): Payer: Non-veteran care | Admitting: Podiatry

## 2016-07-12 ENCOUNTER — Encounter: Payer: Self-pay | Admitting: Podiatry

## 2016-07-12 ENCOUNTER — Ambulatory Visit (INDEPENDENT_AMBULATORY_CARE_PROVIDER_SITE_OTHER): Payer: Non-veteran care

## 2016-07-12 ENCOUNTER — Telehealth: Payer: Self-pay | Admitting: *Deleted

## 2016-07-12 VITALS — BP 149/86 | HR 63

## 2016-07-12 DIAGNOSIS — M722 Plantar fascial fibromatosis: Secondary | ICD-10-CM | POA: Diagnosis not present

## 2016-07-12 DIAGNOSIS — M792 Neuralgia and neuritis, unspecified: Secondary | ICD-10-CM | POA: Diagnosis not present

## 2016-07-12 DIAGNOSIS — R52 Pain, unspecified: Secondary | ICD-10-CM

## 2016-07-12 NOTE — Progress Notes (Addendum)
   Subjective:    Patient ID: Steve Miller, male    DOB: [birth year], 76 y.o.   MRN: [id number]  HPI 76 year old male presents the office today for concerns of left heel pain. He states his been going to the [facility] [place] for this last several years and he gets orthotics every 6 months and his her old and requesting new ones. He denies any recent injury or trauma. His pain in the heel after he is been on his feet. He has a states that he does get numbness and tingling to the right leg and foot. He denies any significant numbness or tingling to the left leg he states on the right-sided seems to be getting worse. Denies any recent injury or trauma to the area no swelling or redness. He has no other concerns otherwise.   Review of Systems  Constitutional: Positive for unexpected weight change.  HENT: Positive for sinus pressure.   Cardiovascular: Positive for leg swelling.  Gastrointestinal: Positive for constipation.  Musculoskeletal: Positive for gait problem.       Joint pain       Objective:   Physical Exam General: AAO x3, NAD  Dermatological: Skin is warm, dry and supple bilateral. Nails x 10 are well manicured; remaining integument appears unremarkable at this time. There are no open sores, no preulcerative lesions, no rash or signs of infection present.  Vascular: Dorsalis Pedis artery and Posterior Tibial artery pedal pulses are 2/4 bilateral with immedate capillary fill time. Pedal hair growth present. No varicosities and no lower extremity edema present bilateral. There is no pain with calf compression, swelling, warmth, erythema.   Neruologic: Grossly intact via light touch bilateral. Vibratory intact via tuning fork bilateral. Protective threshold with Semmes Wienstein monofilament intact to all pedal sites bilateral. Subjectively there is numbness and tingling to the right leg and shooting pain. Negative tinel sign bilaterally.    Musculoskeletal:  HAV is present bilaterally. On the left  side there is tenderness palpation of plantar medial tubercle of the calcaneus at insertion of plantar fascia. There is no pain along the course of the plantar fascia upon her fascia appears to be intact. Is no pain with lateral compression of the calcaneus. There is no open edema, erythema, increase in warmth. There is no other area of tenderness identified bilaterally.  Gait: Unassisted, Nonantalgic.     Assessment & Plan:  76 year old male with left heel pain likely plantar fasciitis; neuritis right leg -Treatment options discussed including all alternatives, risks, and complications -Etiology of symptoms were discussed -X-rays were obtained and reviewed with the patient. No evidence of acute fracture identified. -Regards the neuritis symptoms in the right leg and recommended nerve conduction test which is ordered. -In regards to left heel pain discussed some orthotics. He would benefit from a new pair of orthotics. Unfortunately since he goes the [place] we will see if they can is a prescription for this. -Continue stretching, icing exercises daily as well as supportive shoe gear. Discussed with steroid injection.  Celesta Gentile, DPM  *addendum: Patient has steroid injection performed today into the plantar medial tubercle of the calcaneus near the insertion of her fascia on the area of maximal tenderness. Motion of Kenalog and local anesthetic was infiltrated without complications.

## 2016-07-12 NOTE — Patient Instructions (Signed)

## 2016-07-12 NOTE — Telephone Encounter (Addendum)
-----   Message from Trula Slade, DPM sent at 07/12/2016  2:22 PM EDT ----- Can you order a NCV test for neuritis symptoms in the right leg.07/19/2016-Faxed orders to [facility]:  [facility], clinicals once received and demographics.

## 2016-08-02 ENCOUNTER — Ambulatory Visit (INDEPENDENT_AMBULATORY_CARE_PROVIDER_SITE_OTHER): Payer: Non-veteran care | Admitting: Podiatry

## 2016-08-02 ENCOUNTER — Telehealth: Payer: Self-pay | Admitting: Podiatry

## 2016-08-02 ENCOUNTER — Encounter: Payer: Self-pay | Admitting: Podiatry

## 2016-08-02 DIAGNOSIS — M722 Plantar fascial fibromatosis: Secondary | ICD-10-CM | POA: Diagnosis not present

## 2016-08-02 NOTE — Telephone Encounter (Signed)
Will you please call me back, I forgot the time of my appointment today.

## 2016-08-03 DIAGNOSIS — M722 Plantar fascial fibromatosis: Secondary | ICD-10-CM | POA: Insufficient documentation

## 2016-08-03 NOTE — Progress Notes (Signed)
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 He states he is doing somewhat better commit her last appointment and the brace is helping. The injection did help as well. He still has some discomfort over the heel. He still does the neuritis symptoms the right leg he still awaiting nerve connection test. This has been ordered for the [facility] hospital. They have been icing, stretching, try to wear supportive shoe as much as possible.  No other complaints at this time. No acute changes since last appointment. They deny any systemic complaints such as fevers, chills, nausea, vomiting.  Objective: General: AAO x3, NAD  Dermatological: Skin is warm, dry and supple bilateral. Nails x 10 are well manicured; remaining integument appears unremarkable at this time. There are no open sores, no preulcerative lesions, no rash or signs of infection present.  Vascular: Dorsalis Pedis artery and Posterior Tibial artery pedal pulses are 2/4 bilateral with immedate capillary fill time. Pedal hair growth present. There is no pain with calf compression, swelling, warmth, erythema.   Neruologic: Grossly intact via light touch bilateral. Vibratory intact via tuning fork bilateral. Protective threshold with Semmes Wienstein monofilament intact to all pedal sites bilateral.   Musculoskeletal: There is improved but continued tenderness palpation along the plantar medial tubercle of the calcaneus at the insertion of the plantar fascia on the left foot. There is no pain along the course of the plantar fascia within the arch of the foot. Plantar fascia appears to be intact bilaterally. There is no pain with lateral compression of the calcaneus and there is no pain with vibratory sensation. There is no pain along the course or insertion of the Achilles tendon. There are no other areas of tenderness to bilateral lower extremities. No gross boney pedal deformities bilateral. No pain,  crepitus, or limitation noted with foot and ankle range of motion bilateral. Muscular strength 5/5 in all groups tested bilateral.  Gait: Unassisted, Nonantalgic.   Assessment: Presents for follow-up evaluation for heel pain, likely plantar fasciitis   Plan: -Treatment options discussed including all alternatives, risks, and complications -Patient elects to proceed with steroid injection into the left heel. Under sterile skin preparation, a total of 2.5cc of kenalog 10, 0.5% Marcaine plain, and 2% lidocaine plain were infiltrated into the symptomatic area without complication. A band-aid was applied. Patient tolerated the injection well without complication. Post-injection care with discussed with the patient. Discussed with the patient to ice the area over the next couple of days to help prevent a steroid flare.  -Continue plantar fascial brace. -Ice and stretching exercises on a daily basis. -Continue supportive shoe gear. -Still awaiting nerve conduction test the right leg. Discussed that is not her next 1 week about scheduling this to call the office for follow-up. -Follow-up in 4 weeks or sooner if any problems arise. In the meantime, encouraged to call the office with any questions, concerns, change in symptoms.   Celesta Gentile, DPM

## 2016-08-30 ENCOUNTER — Ambulatory Visit (INDEPENDENT_AMBULATORY_CARE_PROVIDER_SITE_OTHER): Payer: Non-veteran care | Admitting: Podiatry

## 2016-08-30 ENCOUNTER — Encounter: Payer: Self-pay | Admitting: Podiatry

## 2016-08-30 DIAGNOSIS — M722 Plantar fascial fibromatosis: Secondary | ICD-10-CM

## 2016-08-30 DIAGNOSIS — M792 Neuralgia and neuritis, unspecified: Secondary | ICD-10-CM

## 2016-08-31 ENCOUNTER — Telehealth: Payer: Self-pay | Admitting: *Deleted

## 2016-08-31 NOTE — Telephone Encounter (Addendum)
-----   Message from Trula Slade, DPM sent at 08/31/2016  7:47 AM EDT ----- Can you please follow up on his NCV test? He has not heard anything. On hold for 7 minutes, had to assist another caller. 09/07/2016-I spoke with [facility] line, she states pt has to get the referral from his primary doctor, Dr. Nolene Bernheim [phone]. I informed pt of Banielle's instructions and to call me with Dr. Jodie Echevaria fax number and I would fax his records. Pt states understanding.09/13/2016-Pt gave Dr. Sheryle Spray fax [phone]. Faxed clinicals with note from Dr. Jacqualyn Posey requesting NCV with [facility].

## 2016-08-31 NOTE — Progress Notes (Signed)
Subjective: Steve Miller presents to the office today for follow-up evaluation of left heel pain. They state that they are doing somewhat better but still having pain. They have been icing, stretching, try to wear supportive shoe as much as possible. She states the steroid injections to help however they do wear off after couple weeks and the pain starts to come back. He is asked met orthotics today. He has not got a nerve conduction test done for the right leg yet. He has not been contacted either. No other complaints at this time. No acute changes since last appointment. They deny any systemic complaints such as fevers, chills, nausea, vomiting.  Objective: General: AAO x3, NAD  Dermatological: Skin is warm, dry and supple bilateral. There are no open sores, no preulcerative lesions, no rash or signs of infection present.  Vascular: Dorsalis Pedis artery and Posterior Tibial artery pedal pulses are 2/4 bilateral with immedate capillary fill time.There is no pain with calf compression, swelling, warmth, erythema.   Neruologic: Grossly intact via light touch bilateral. Some having subjective numbness and tingling sharp pains on the right leg.  Musculoskeletal: There is continued tenderness palpation along the plantar medial tubercle of the calcaneus at the insertion of the plantar fascia on the left foot. There is no pain along the course of the plantar fascia within the arch of the foot. Plantar fascia appears to be intact bilaterally. There is no pain with lateral compression of the calcaneus and there is no pain with vibratory sensation. There is no pain along the course or insertion of the Achilles tendon. There are no other areas of tenderness to bilateral lower extremities. No gross boney pedal deformities bilateral. No pain, crepitus, or limitation noted with foot and ankle range of motion bilateral. Muscular strength 5/5 in all groups tested bilateral.  Gait: Unassisted, Nonantalgic.    Assessment: Presents for follow-up evaluation for heel pain, likely plantar fasciitis   Plan: -Treatment options discussed including all alternatives, risks, and complications -Patient elects to proceed with steroid injection into the left heel. Under sterile skin preparation, a total of 2.5cc of kenalog 10, 0.5% Marcaine plain, and 2% lidocaine plain were infiltrated into the symptomatic area without complication. A band-aid was applied. Patient tolerated the injection well without complication. Post-injection care with discussed with the patient. Discussed with the patient to ice the area over the next couple of days to help prevent a steroid flare.  -Powersteps. We have been trying to get CMO but due to [place] we cannot do his. I have sent his chart notes to he [place].  -Ice and stretching exercises on a daily basis. -Continue supportive shoe gear. -Will follow-up with the NCV test.  -Follow-up in 4 weeks or sooner if any problems arise. In the meantime, encouraged to call the office with any questions, concerns, change in symptoms.   Celesta Gentile, DPM

## 2016-09-07 NOTE — Telephone Encounter (Deleted)
-----   Message from Trula Slade, DPM sent at 08/31/2016  7:47 AM EDT ----- Can you please follow up on his NCV test? He has not heard anything.

## 2016-09-27 ENCOUNTER — Encounter: Payer: Self-pay | Admitting: Podiatry

## 2016-09-27 ENCOUNTER — Ambulatory Visit (INDEPENDENT_AMBULATORY_CARE_PROVIDER_SITE_OTHER): Payer: Non-veteran care | Admitting: Podiatry

## 2016-09-27 DIAGNOSIS — M722 Plantar fascial fibromatosis: Secondary | ICD-10-CM | POA: Diagnosis not present

## 2016-09-27 DIAGNOSIS — M792 Neuralgia and neuritis, unspecified: Secondary | ICD-10-CM | POA: Diagnosis not present

## 2016-09-27 MED ORDER — METHYLPREDNISOLONE 4 MG PO TBPK
ORAL_TABLET | ORAL | 0 refills | Status: DC
Start: 2016-09-27 — End: 2016-10-25

## 2016-09-30 NOTE — Progress Notes (Signed)
Subjective: Steve Miller presents to the office today for follow-up evaluation of left heel pain. He states he is doing better and then injections to help for some time for the pain service come back. Stretching icing intermittently. He has not yet received custom inserts. He was wearing the power steps which did help. Denies any recent injury or trauma to the left foot and denies any numbness or tingling to left foot. He still gets some burning pain to the right leg however it is improving there is only minimal symptoms the right leg at this time. Does not wake him up at night. He has not yet heard about the nerve conduction test. We have contacted the [facility] in regards to both of these issues in regards the orthotics as well as nerve conduction test. He denies any systemic complaints such as fevers, chills, nausea, vomiting.  Objective: General: AAO x3, NAD  Dermatological: Skin is warm, dry and supple bilateral. There are no open sores, no preulcerative lesions, no rash or signs of infection present.  Vascular: Dorsalis Pedis artery and Posterior Tibial artery pedal pulses are 2/4 bilateral with immedate capillary fill time.There is no pain with calf compression, swelling, warmth, erythema.   Neruologic: Grossly intact via light touch bilateral. Some having subjective numbness and tingling sharp pains on the right leg but he states "it is getting better".   Musculoskeletal: There is continued tenderness palpation along the plantar medial tubercle of the calcaneus at the insertion of the plantar fascia on the left foot. There is no pain along the course of the plantar fascia within the arch of the foot. Plantar fascia appears to be intact bilaterally. There is no pain with lateral compression of the calcaneus and there is no pain with vibratory sensation. There is no pain along the course or insertion of the Achilles tendon. There are no other areas of tenderness to bilateral lower extremities. No gross  boney pedal deformities bilateral. No pain, crepitus, or limitation noted with foot and ankle range of motion bilateral. Muscular strength 5/5 in all groups tested bilateral. Overall exam is unchanged.   Gait: Unassisted, Nonantalgic.   Assessment: Presents for follow-up evaluation for heel pain, likely plantar fasciitis   Plan: -Treatment options discussed including all alternatives, risks, and complications -He's had 3 previous steroid injections. I did prescribe a Medrol Dosepak today. He has not been wearing the power sets and recommended continue with this he's been wearing the plantar fascial brace it is also been helping. Continue with stretching and icing edges of the daily. We'll again contact the [facility] in regards to custom orthotics as I think this will help alleviate his symptoms as he is doing well with the brace and the over-the-counter inserts. Although he is doing well the over-the-counter inserts he states that he still gets some pain is not completely resolved icing on a custom normal help him more. -In regards to numbness, burning pain in her right leg I recommended a nerve conduction test in the last several appointments he has not yet heard back from the [place] in regards to this. We will follow up with [place] as well.  Celesta Gentile, DPM

## 2016-10-01 ENCOUNTER — Telehealth: Payer: Self-pay

## 2016-10-01 NOTE — Telephone Encounter (Signed)
Spoke with patient advising him of [facility] instructions regarding nerve conduction study :  09/07/2016-I spoke with [place], she states pt has to get the referral from his primary doctor, Dr. Nolene Bernheim [phone]. I informed pt of Banielle's instructions and to call me with Dr. Jodie Echevaria fax number and I would fax his records. Pt states understanding.09/13/2016-Pt gave Dr. Sheryle Spray fax [phone]. Faxed clinicals with note from Dr. Jacqualyn Posey requesting NCV with EMG.  Advised him to follow up with his PCP to see if they have completed this order. He also inquired about the status of his custom inserts through the [place].   Order for inserts and all clinical notes refaxed today to [place]

## 2016-10-25 ENCOUNTER — Encounter: Payer: Self-pay | Admitting: Podiatry

## 2016-10-25 ENCOUNTER — Ambulatory Visit (INDEPENDENT_AMBULATORY_CARE_PROVIDER_SITE_OTHER): Payer: Non-veteran care | Admitting: Podiatry

## 2016-10-25 DIAGNOSIS — M722 Plantar fascial fibromatosis: Secondary | ICD-10-CM | POA: Diagnosis not present

## 2016-10-25 DIAGNOSIS — M792 Neuralgia and neuritis, unspecified: Secondary | ICD-10-CM

## 2016-10-28 NOTE — Progress Notes (Signed)
Subjective: Steve Miller presents to the office today for follow-up evaluation of left heel pain. He states he is doing better. He still gets some discomfort but overall it has improved. Denies any recent injury or trauma. No swelling or redness. He states that he has still some sharp pain, and burning to his right foot and leg on the outside aspect. We're still awaiting a nerve conduction test. He denies he swelling bilaterally any redness or warmth. He has no other concerns today. He denies any systemic complaints such as fevers, chills, nausea, vomiting.  Objective: General: AAO x3, NAD  Dermatological: Skin is warm, dry and supple bilateral. There are no open sores, no preulcerative lesions, no rash or signs of infection present.  Vascular: Dorsalis Pedis artery and Posterior Tibial artery pedal pulses are 2/4 bilateral with immedate capillary fill time.There is no pain with calf compression, swelling, warmth, erythema.   Neruologic: Grossly intact via light touch bilateral. Some having subjective numbness and tingling sharp pains on the right leg and foot.   Musculoskeletal: There is continued but improved tenderness to palpation along the plantar medial tubercle of the calcaneus at the insertion of the plantar fascia on the left foot. There is no pain along the course of the plantar fascia within the arch of the foot. Plantar fascia appears to be intact bilaterally. There is no pain with lateral compression of the calcaneus and there is no pain with vibratory sensation. There is no pain along the course or insertion of the Achilles tendon. There are no other areas of tenderness to bilateral lower extremities. No gross boney pedal deformities bilateral. No pain, crepitus, or limitation noted with foot and ankle range of motion bilateral. Muscular strength 5/5 in all groups tested bilateral. Overall exam is unchanged.   Gait: Unassisted, Nonantalgic.   Assessment: Presents for follow-up  evaluation for heel pain, likely plantar fasciitis   Plan: -Treatment options discussed including all alternatives, risks, and complications -Fist pain is improving. Using long-term benefit more from a custom molded orthotic. [facility] and multiple occasions trying to get him a prescription for this. I would have been given prescriptions for [facility] to get custom orthotics made. Also discussed obtaining shoes and wound continue with stretching, icing exercises daily. Again still awaiting nerve conduction test of the right leg. Also recommended follow up with his primary care physician for this as they're having difficulty getting him adequately treated. He has no further concerns or questions today.  Celesta Gentile, DPM

## 2016-11-05 ENCOUNTER — Telehealth: Payer: Self-pay | Admitting: *Deleted

## 2016-11-05 NOTE — Telephone Encounter (Signed)
Steve Miller and [facility] states Dr. Jacqualyn Posey ordered orthotics for pt and if we would fax the order to the [place] [phone], they would make sure pt got the orthotics.

## 2016-11-05 NOTE — Telephone Encounter (Signed)
Rx provided and given to [facility]

## 2018-08-09 ENCOUNTER — Other Ambulatory Visit: Payer: Self-pay

## 2018-08-09 DIAGNOSIS — Z20822 Contact with and (suspected) exposure to covid-19: Secondary | ICD-10-CM

## 2018-08-10 LAB — NOVEL CORONAVIRUS, NAA: SARS-CoV-2, NAA: NOT DETECTED

## 2018-10-20 DIAGNOSIS — M19042 Primary osteoarthritis, left hand: Secondary | ICD-10-CM | POA: Insufficient documentation

## 2018-10-20 DIAGNOSIS — M6530 Trigger finger, unspecified finger: Secondary | ICD-10-CM | POA: Insufficient documentation

## 2019-01-17 ENCOUNTER — Other Ambulatory Visit: Payer: Self-pay | Admitting: Orthopedic Surgery

## 2019-01-17 ENCOUNTER — Ambulatory Visit: Payer: No Typology Code available for payment source | Attending: Internal Medicine

## 2019-01-17 DIAGNOSIS — Z23 Encounter for immunization: Secondary | ICD-10-CM | POA: Diagnosis present

## 2019-01-17 DIAGNOSIS — G5622 Lesion of ulnar nerve, left upper limb: Secondary | ICD-10-CM | POA: Insufficient documentation

## 2019-01-17 DIAGNOSIS — G5602 Carpal tunnel syndrome, left upper limb: Secondary | ICD-10-CM | POA: Insufficient documentation

## 2019-01-17 NOTE — Progress Notes (Signed)
   Covid-19 Vaccination Clinic  Name:  Steve Miller    MRN: [id number] DOB: [birth year]  01/17/2019  [name] was observed post Covid-19 immunization for 15 minutes without incidence. He was provided with Vaccine Information Sheet and instruction to access the V-Safe system.   [name] was instructed to call 911 with any severe reactions post vaccine: Marland Kitchen Difficulty breathing  . Swelling of your face and throat  . A fast heartbeat  . A bad rash all over your body  . Dizziness and weakness    Immunizations Administered    Name Date Dose VIS Date Route   Pfizer COVID-19 Vaccine 01/17/2019  9:24 AM 0.3 mL 12/15/2018 Intramuscular   Manufacturer: [employer]   Lot: [id number]   [place]: [id number]

## 2019-01-18 ENCOUNTER — Other Ambulatory Visit: Payer: Self-pay

## 2019-01-18 ENCOUNTER — Other Ambulatory Visit (HOSPITAL_COMMUNITY): Payer: Non-veteran care

## 2019-01-18 ENCOUNTER — Encounter (HOSPITAL_BASED_OUTPATIENT_CLINIC_OR_DEPARTMENT_OTHER): Payer: Self-pay | Admitting: Orthopedic Surgery

## 2019-01-18 ENCOUNTER — Encounter (HOSPITAL_BASED_OUTPATIENT_CLINIC_OR_DEPARTMENT_OTHER)
Admission: RE | Admit: 2019-01-18 | Discharge: 2019-01-18 | Disposition: A | Discharge status: Home / Self Care | Payer: No Typology Code available for payment source | Source: Ambulatory Visit | Attending: Orthopedic Surgery | Admitting: Orthopedic Surgery

## 2019-01-18 DIAGNOSIS — Z01812 Encounter for preprocedural laboratory examination: Secondary | ICD-10-CM | POA: Diagnosis present

## 2019-01-18 LAB — BASIC METABOLIC PANEL
Anion gap: 11 (ref 5–15)
BUN: 11 mg/dL (ref 8–23)
CO2: 24 mmol/L (ref 22–32)
Calcium: 9 mg/dL (ref 8.9–10.3)
Chloride: 107 mmol/L (ref 98–111)
Creatinine, Ser: 1.27 mg/dL — ABNORMAL HIGH (ref 0.61–1.24)
GFR calc Af Amer: >60 mL/min (ref >60)
GFR calc non Af Amer: 55 mL/min — ABNORMAL LOW (ref >60)
Glucose, Bld: 154 mg/dL — ABNORMAL HIGH (ref 70–99)
Potassium: 3.8 mmol/L (ref 3.5–5.1)
Sodium: 142 mmol/L (ref 135–145)

## 2019-01-18 NOTE — Progress Notes (Signed)
EKG reviewed by Dr. Jillyn Hidden, will proceed with surgery as scheduled.

## 2019-01-18 NOTE — Progress Notes (Signed)

## 2019-01-19 ENCOUNTER — Other Ambulatory Visit (HOSPITAL_COMMUNITY)
Admission: RE | Admit: 2019-01-19 | Discharge: 2019-01-19 | Disposition: A | Discharge status: Home / Self Care | Payer: No Typology Code available for payment source | Source: Ambulatory Visit | Attending: Orthopedic Surgery | Admitting: Orthopedic Surgery

## 2019-01-19 DIAGNOSIS — Z20822 Contact with and (suspected) exposure to covid-19: Secondary | ICD-10-CM | POA: Insufficient documentation

## 2019-01-19 DIAGNOSIS — Z01812 Encounter for preprocedural laboratory examination: Secondary | ICD-10-CM | POA: Diagnosis present

## 2019-01-20 LAB — NOVEL CORONAVIRUS, NAA (HOSP ORDER, SEND-OUT TO REF LAB; TAT 18-24 HRS): SARS-CoV-2, NAA: NOT DETECTED

## 2019-01-23 ENCOUNTER — Ambulatory Visit (HOSPITAL_BASED_OUTPATIENT_CLINIC_OR_DEPARTMENT_OTHER)
Admission: RE | Admit: 2019-01-23 | Discharge: 2019-01-23 | Disposition: A | Discharge status: Home / Self Care | Payer: No Typology Code available for payment source | Attending: Orthopedic Surgery | Admitting: Orthopedic Surgery

## 2019-01-23 ENCOUNTER — Encounter (HOSPITAL_BASED_OUTPATIENT_CLINIC_OR_DEPARTMENT_OTHER)
Admission: RE | Disposition: A | Discharge status: Home / Self Care | Payer: Self-pay | Source: Home / Self Care | Attending: Orthopedic Surgery

## 2019-01-23 ENCOUNTER — Ambulatory Visit (HOSPITAL_BASED_OUTPATIENT_CLINIC_OR_DEPARTMENT_OTHER): Payer: No Typology Code available for payment source | Admitting: Anesthesiology

## 2019-01-23 ENCOUNTER — Encounter (HOSPITAL_BASED_OUTPATIENT_CLINIC_OR_DEPARTMENT_OTHER): Payer: Self-pay | Admitting: Orthopedic Surgery

## 2019-01-23 ENCOUNTER — Other Ambulatory Visit: Payer: Self-pay

## 2019-01-23 DIAGNOSIS — G5602 Carpal tunnel syndrome, left upper limb: Secondary | ICD-10-CM | POA: Insufficient documentation

## 2019-01-23 DIAGNOSIS — I10 Essential (primary) hypertension: Secondary | ICD-10-CM | POA: Diagnosis not present

## 2019-01-23 DIAGNOSIS — M65342 Trigger finger, left ring finger: Secondary | ICD-10-CM | POA: Diagnosis not present

## 2019-01-23 DIAGNOSIS — D45 Polycythemia vera: Secondary | ICD-10-CM | POA: Insufficient documentation

## 2019-01-23 DIAGNOSIS — G5622 Lesion of ulnar nerve, left upper limb: Secondary | ICD-10-CM | POA: Diagnosis not present

## 2019-01-23 DIAGNOSIS — G629 Polyneuropathy, unspecified: Secondary | ICD-10-CM | POA: Insufficient documentation

## 2019-01-23 DIAGNOSIS — M65332 Trigger finger, left middle finger: Secondary | ICD-10-CM | POA: Diagnosis not present

## 2019-01-23 DIAGNOSIS — K219 Gastro-esophageal reflux disease without esophagitis: Secondary | ICD-10-CM | POA: Diagnosis not present

## 2019-01-23 DIAGNOSIS — Z79899 Other long term (current) drug therapy: Secondary | ICD-10-CM | POA: Diagnosis not present

## 2019-01-23 DIAGNOSIS — Z96651 Presence of right artificial knee joint: Secondary | ICD-10-CM | POA: Diagnosis not present

## 2019-01-23 HISTORY — DX: Polycythemia vera: D45

## 2019-01-23 HISTORY — PX: CARPAL TUNNEL RELEASE: SHX101

## 2019-01-23 HISTORY — DX: Polyneuropathy, unspecified: G62.9

## 2019-01-23 HISTORY — PX: TRIGGER FINGER RELEASE: SHX641

## 2019-01-23 HISTORY — PX: ULNAR NERVE TRANSPOSITION: SHX2595

## 2019-01-23 SURGERY — CARPAL TUNNEL RELEASE
Anesthesia: General | Site: Wrist | Laterality: Left

## 2019-01-23 MED ORDER — BUPIVACAINE HCL (PF) 0.25 % IJ SOLN
INTRAMUSCULAR | Status: DC | PRN
  Administered 2019-01-23: 10 mL

## 2019-01-23 MED ORDER — ONDANSETRON HCL 4 MG/2ML IJ SOLN
INTRAMUSCULAR | Status: DC | PRN
  Administered 2019-01-23: 4 mg via INTRAVENOUS

## 2019-01-23 MED ORDER — EPHEDRINE SULFATE-NACL 50-0.9 MG/10ML-% IV SOSY
PREFILLED_SYRINGE | INTRAVENOUS | Status: DC | PRN
  Administered 2019-01-23: 10 mg via INTRAVENOUS
  Administered 2019-01-23: 5 mg via INTRAVENOUS
  Administered 2019-01-23: 10 mg via INTRAVENOUS

## 2019-01-23 MED ORDER — CEFAZOLIN SODIUM-DEXTROSE 2-4 GM/100ML-% IV SOLN
INTRAVENOUS | Status: AC
  Filled 2019-01-23: qty 100

## 2019-01-23 MED ORDER — OXYCODONE HCL 5 MG PO TABS: 5.0000 mg | ORAL_TABLET | Freq: Once | ORAL | Status: DC | PRN

## 2019-01-23 MED ORDER — FENTANYL CITRATE (PF) 100 MCG/2ML IJ SOLN: 25.0000 ug | INTRAMUSCULAR | Status: DC | PRN

## 2019-01-23 MED ORDER — PROPOFOL 10 MG/ML IV BOLUS
INTRAVENOUS | Status: AC
  Filled 2019-01-23: qty 20

## 2019-01-23 MED ORDER — OXYCODONE HCL 5 MG/5ML PO SOLN: 5.0000 mg | Freq: Once | ORAL | Status: DC | PRN

## 2019-01-23 MED ORDER — MIDAZOLAM HCL 2 MG/2ML IJ SOLN: 1.0000 mg | INTRAMUSCULAR | Status: DC | PRN

## 2019-01-23 MED ORDER — ACETAMINOPHEN 500 MG PO TABS
1000.0000 mg | ORAL_TABLET | Freq: Once | ORAL | Status: AC
  Administered 2019-01-23: 12:00:00 1000 mg via ORAL

## 2019-01-23 MED ORDER — LACTATED RINGERS IV SOLN: INTRAVENOUS | Status: DC

## 2019-01-23 MED ORDER — HYDROCODONE-ACETAMINOPHEN 5-325 MG PO TABS: ORAL_TABLET | ORAL | 0 refills | Status: DC

## 2019-01-23 MED ORDER — ACETAMINOPHEN 500 MG PO TABS
ORAL_TABLET | ORAL | Status: AC
  Filled 2019-01-23: qty 2

## 2019-01-23 MED ORDER — DEXAMETHASONE SODIUM PHOSPHATE 10 MG/ML IJ SOLN
INTRAMUSCULAR | Status: DC | PRN
  Administered 2019-01-23: 5 mg via INTRAVENOUS

## 2019-01-23 MED ORDER — CHLORHEXIDINE GLUCONATE 4 % EX LIQD: 60.0000 mL | Freq: Once | CUTANEOUS | Status: DC

## 2019-01-23 MED ORDER — PROPOFOL 10 MG/ML IV BOLUS
INTRAVENOUS | Status: DC | PRN
  Administered 2019-01-23: 150 mg via INTRAVENOUS

## 2019-01-23 MED ORDER — FENTANYL CITRATE (PF) 100 MCG/2ML IJ SOLN
INTRAMUSCULAR | Status: AC
  Filled 2019-01-23: qty 2

## 2019-01-23 MED ORDER — CEFAZOLIN SODIUM-DEXTROSE 2-4 GM/100ML-% IV SOLN
2.0000 g | INTRAVENOUS | Status: AC
  Administered 2019-01-23: 2 g via INTRAVENOUS

## 2019-01-23 MED ORDER — FENTANYL CITRATE (PF) 100 MCG/2ML IJ SOLN
50.0000 ug | INTRAMUSCULAR | Status: AC | PRN
  Administered 2019-01-23 (×3): 25 ug via INTRAVENOUS
  Administered 2019-01-23: 50 ug via INTRAVENOUS
  Administered 2019-01-23: 14:00:00 25 ug via INTRAVENOUS

## 2019-01-23 MED ORDER — PROMETHAZINE HCL 25 MG/ML IJ SOLN: 6.2500 mg | INTRAMUSCULAR | Status: DC | PRN

## 2019-01-23 MED ORDER — LIDOCAINE 2% (20 MG/ML) 5 ML SYRINGE
INTRAMUSCULAR | Status: DC | PRN
  Administered 2019-01-23: 40 mg via INTRAVENOUS
  Administered 2019-01-23: 60 mg via INTRAVENOUS

## 2019-01-23 SURGICAL SUPPLY — 59 items
BLADE MINI RND TIP GREEN BEAV (BLADE) IMPLANT
BLADE SURG 15 STRL LF DISP TIS (BLADE) ×6 IMPLANT
BLADE SURG 15 STRL SS (BLADE) ×4
BNDG COHESIVE 2X5 TAN STRL LF (GAUZE/BANDAGES/DRESSINGS) ×5 IMPLANT
BNDG ELASTIC 3X5.8 VLCR STR LF (GAUZE/BANDAGES/DRESSINGS) ×10 IMPLANT
BNDG ELASTIC 4X5.8 VLCR STR LF (GAUZE/BANDAGES/DRESSINGS) ×5 IMPLANT
BNDG ESMARK 4X9 LF (GAUZE/BANDAGES/DRESSINGS) ×5 IMPLANT
BNDG GAUZE ELAST 4 BULKY (GAUZE/BANDAGES/DRESSINGS) ×5 IMPLANT
CHLORAPREP W/TINT 26 (MISCELLANEOUS) ×5 IMPLANT
CORD BIPOLAR FORCEPS 12FT (ELECTRODE) ×5 IMPLANT
COVER BACK TABLE 60X90IN (DRAPES) ×5 IMPLANT
COVER MAYO STAND STRL (DRAPES) ×5 IMPLANT
COVER WAND RF STERILE (DRAPES) IMPLANT
CUFF TOURN SGL QUICK 18X3 (MISCELLANEOUS) ×5 IMPLANT
CUFF TOURN SGL QUICK 18X4 (TOURNIQUET CUFF) ×5 IMPLANT
DECANTER SPIKE VIAL GLASS SM (MISCELLANEOUS) IMPLANT
DRAPE EXTREMITY T 121X128X90 (DISPOSABLE) ×5 IMPLANT
DRAPE SURG 17X23 STRL (DRAPES) ×5 IMPLANT
DRSG PAD ABDOMINAL 8X10 ST (GAUZE/BANDAGES/DRESSINGS) ×5 IMPLANT
GAUZE 4X4 16PLY RFD (DISPOSABLE) IMPLANT
GAUZE SPONGE 4X4 12PLY STRL (GAUZE/BANDAGES/DRESSINGS) ×5 IMPLANT
GAUZE XEROFORM 1X8 LF (GAUZE/BANDAGES/DRESSINGS) ×5 IMPLANT
GLOVE BIO SURGEON STRL SZ 6.5 (GLOVE) ×2 IMPLANT
GLOVE BIO SURGEON STRL SZ7.5 (GLOVE) ×5 IMPLANT
GLOVE BIO SURGEONS STRL SZ 6.5 (GLOVE) ×2
GLOVE BIOGEL PI IND STRL 7.0 (GLOVE) IMPLANT
GLOVE BIOGEL PI IND STRL 8 (GLOVE) ×3 IMPLANT
GLOVE BIOGEL PI IND STRL 8.5 (GLOVE) ×3 IMPLANT
GLOVE BIOGEL PI INDICATOR 7.0 (GLOVE) ×4
GLOVE BIOGEL PI INDICATOR 8 (GLOVE) ×2
GLOVE BIOGEL PI INDICATOR 8.5 (GLOVE) ×2
GLOVE SURG ORTHO 8.0 STRL STRW (GLOVE) ×5 IMPLANT
GOWN STRL REUS W/ TWL LRG LVL3 (GOWN DISPOSABLE) ×3 IMPLANT
GOWN STRL REUS W/TWL LRG LVL3 (GOWN DISPOSABLE) ×6
GOWN STRL REUS W/TWL XL LVL3 (GOWN DISPOSABLE) ×10 IMPLANT
NDL HYPO 25X1 1.5 SAFETY (NEEDLE) ×3 IMPLANT
NEEDLE HYPO 25X1 1.5 SAFETY (NEEDLE) ×5 IMPLANT
NS IRRIG 1000ML POUR BTL (IV SOLUTION) ×5 IMPLANT
PACK BASIN DAY SURGERY FS (CUSTOM PROCEDURE TRAY) ×5 IMPLANT
PAD CAST 3X4 CTTN HI CHSV (CAST SUPPLIES) ×3 IMPLANT
PAD CAST 4YDX4 CTTN HI CHSV (CAST SUPPLIES) ×3 IMPLANT
PADDING CAST ABS 4INX4YD NS (CAST SUPPLIES) ×2
PADDING CAST ABS COTTON 4X4 ST (CAST SUPPLIES) ×3 IMPLANT
PADDING CAST COTTON 3X4 STRL (CAST SUPPLIES) ×2
PADDING CAST COTTON 4X4 STRL (CAST SUPPLIES) ×2
SLEEVE SCD COMPRESS KNEE MED (MISCELLANEOUS) ×5 IMPLANT
SPLINT FAST PLASTER 5X30 (CAST SUPPLIES)
SPLINT PLASTER CAST FAST 5X30 (CAST SUPPLIES) IMPLANT
SPLINT PLASTER CAST XFAST 3X15 (CAST SUPPLIES) IMPLANT
SPLINT PLASTER XTRA FASTSET 3X (CAST SUPPLIES)
STOCKINETTE 4X48 STRL (DRAPES) ×5 IMPLANT
SUT ETHILON 4 0 PS 2 18 (SUTURE) ×5 IMPLANT
SUT VIC AB 2-0 SH 27 (SUTURE) ×2
SUT VIC AB 2-0 SH 27XBRD (SUTURE) ×3 IMPLANT
SUT VICRYL 4-0 PS2 18IN ABS (SUTURE) IMPLANT
SYR BULB 3OZ (MISCELLANEOUS) ×5 IMPLANT
SYR CONTROL 10ML LL (SYRINGE) ×5 IMPLANT
TOWEL GREEN STERILE FF (TOWEL DISPOSABLE) ×10 IMPLANT
UNDERPAD 30X36 HEAVY ABSORB (UNDERPADS AND DIAPERS) ×5 IMPLANT

## 2019-01-23 NOTE — Anesthesia Postprocedure Evaluation (Signed)
Anesthesia Post Note  Patient: Steve Miller  Procedure(s) Performed: LEFT CARPAL TUNNEL RELEASE (Left Wrist) LEFT LONG AND TRIGGER RELEASE TRIGGER FINGER/A-1 PULLEY (Left Hand) LEFT ULNAR NERVE DECOMPRESSION (Left Elbow)     Patient location during evaluation: PACU Anesthesia Type: General Level of consciousness: awake and alert and oriented Pain management: pain level controlled Vital Signs Assessment: post-procedure vital signs reviewed and stable Respiratory status: spontaneous breathing, nonlabored ventilation and respiratory function stable Cardiovascular status: blood pressure returned to baseline Postop Assessment: no apparent nausea or vomiting Anesthetic complications: no    Last Vitals:  Vitals:   01/23/19 1445 01/23/19 1500  BP: 124/76 120/74  Pulse: 80 73  Resp: 14 13  Temp:    SpO2: 98% 96%    Last Pain:  Vitals:   01/23/19 1500  TempSrc:   PainSc: 0-No pain                 Brennan Bailey

## 2019-01-23 NOTE — Op Note (Signed)
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  Left carpal tunnel release 2.  Left ulnar nerve decompression at the elbow 3.  Left long finger trigger release 4.  Left ring finger trigger release  SURGEON:  Leanora Cover, MD  ASSISTANT: Daryll Brod, MD.  ANESTHESIA: General  IV FLUIDS:  Per anesthesia flow sheet.  ESTIMATED BLOOD LOSS:  Minimal.  COMPLICATIONS:  None.  SPECIMENS:  None.  TOURNIQUET TIME:    Total Tourniquet Time Documented: Upper Arm (Left) - 54 minutes Total: Upper Arm (Left) - 54 minutes   DISPOSITION:  Stable to PACU.  LOCATION:  SURGERY CENTER  INDICATIONS: 79 year old male with numbness and tingling in the left hand.  Positive nerve conduction studies.  He wishes to have left carpal tunnel release and ulnar nerve decompression at the elbow.  He also has triggering of the left long and ring fingers and would like these released as well. He wishes to have a carpal tunnel release for management of his symptoms.  Risks, benefits and alternatives of surgery were discussed including the risk of blood loss; infection; damage to nerves, vessels, tendons, ligaments, bone; failure of surgery; need for additional surgery; complications with wound healing; continued pain; recurrence of carpal tunnel syndrome; and damage to motor branch. He voiced understanding of these risks and elected to proceed.   OPERATIVE COURSE:  After being identified preoperatively by myself, the patient and I agreed upon the procedure and site of procedure.  The surgical site was marked.  The risks, benefits, and alternatives of the  surgery were reviewed and he wished to proceed.  Surgical consent had been signed.  He was given IV Ancef as preoperative antibiotic prophylaxis.  He was transferred to the operating room and placed on the operating room table in supine position with the Left upper extremity on an armboard.  General anesthesia was induced by the anesthesiologist.  Left upper extremity was prepped and draped in normal sterile orthopaedic fashion.  A surgical pause was performed between the surgeons, anesthesia, and operating room staff, and all were in agreement as to the patient, procedure, and site of procedure.  Tourniquet at the proximal aspect of the extremity was inflated to 250 mmHg after exsanguination of the arm with an Esmarch bandage  An incision was made at the volar aspect of the MP joint of the ring finger.  This was carried into the subcutaneous tissues by spreading technique.  Bipolar electrocautery was used to obtain hemostasis.  The radial and ulnar digital nerves were protected throughout the case. The flexor sheath was identified.  The A1 pulley was identified and sharply incised.  It was released in its entirety.  The proximal 1-2 mm of the A2 pulley was vented to allow better excursion of the tendons.  The finger was placed through a range of motion and there was noted to be no catching.  The tendons were brought through the wound and any adherences released.  An incision was made at the volar aspect of the MP  joint of the long finger.  This was carried into the subcutaneous tissues by preading technique.  Bipolar electrocautery was used to obtain hemostasis.  The radial and ulnar digital nerves were protected throughout the case. The flexor sheath was identified.  The A1 pulley was identified and sharply incised.  It was released in its entirety.  The proximal 1-2 mm of the A2 pulley was vented to allow better excursion of the tendons.  The finger was placed through a range of motion and there was noted to be  no catching.  The tendons were brought through the wound and any adherences released.  The wounds were then copiously irrigated with sterile saline.  They were closed with 4-0 nylon in a horizontal mattress fashion.  Incision was made over the transverse carpal ligament and carried into the subcutaneous tissues by spreading technique.  Bipolar electrocautery was used to obtain hemostasis.  The palmar fascia was sharply incised.  The transverse carpal ligament was identified and sharply incised.  It was incised distally first.  Care was taken to ensure complete decompression distally.  It was then incised proximally.  Scissors were used to split the distal aspect of the volar antebrachial fascia.  A finger was placed into the wound to ensure complete decompression, which was the case.  The nerve was examined.  It was flattened and hyperemic. and It was adherent to the radial leaflet.  The motor branch was identified and was intact.  The wound was copiously irrigated with sterile saline.  It was then closed with 4-0 nylon in a horizontal mattress fashion.  Incision was then made at the medial side of the elbow.  This is carried in subcu subcutaneous tissues by spreading technique.  Bipolar electrocautery is used to obtain hemostasis.  The ulnar nerve was identified at the proximal aspect of Osborne's ligament.  Osborne's ligament was then divided while carefully protecting the nerve.  The fascia over the muscle distally was then released.  The FCU muscle spread and the investing fascia over the nerve released.  Motor branch to the FCU was identified and protected.  A finger was placed into the wound and there was good decompression of the nerve.  The nerve was then decompressed proximally.  Again the fascia overlying the muscle was released and then the investing fascia surrounding the nerve released.  The [facility] guide was used to protect the nerve while releasing the investing fascia.  A finger was placed into the wound  and again good decompression was obtained.  The wound was copiously irrigated with sterile saline.  It was closed with 4-0 nylon in a horizontal mattress fashion.  The wounds were injected with 0.25% plain Marcaine to aid in postoperative analgesia.  They were dressed with sterile Xeroform, 4x4s, an ABD, and wrapped with Kerlix and an Ace bandage.  Tourniquet was deflated at 54 minutes.  Fingertips were pink with brisk capillary refill after deflation of the tourniquet.  Operative drapes were broken down.  The patient was awoken from anesthesia safely.  He was transferred back to stretcher and taken to the PACU in stable condition.  I will see him back in the office in 1 week for postoperative followup.  I will give him a prescription for Norco 5/325 1-2 tabs PO q6 hours prn pain, dispense # 20.    Leanora Cover, MD Electronically signed, 01/23/19

## 2019-01-23 NOTE — Transfer of Care (Signed)
Immediate Anesthesia Transfer of Care Note  Patient: Edilson Gan  Procedure(s) Performed: LEFT CARPAL TUNNEL RELEASE (Left Wrist) LEFT LONG AND TRIGGER RELEASE TRIGGER FINGER/A-1 PULLEY (Left Hand) LEFT ULNAR NERVE DECOMPRESSION (Left Elbow)  Patient Location: PACU  Anesthesia Type:General  Level of Consciousness: drowsy and patient cooperative  Airway & Oxygen Therapy: Patient Spontanous Breathing and Patient connected to face mask oxygen  Post-op Assessment: Report given to RN and Post -op Vital signs reviewed and stable  Post vital signs: Reviewed and stable  Last Vitals:  Vitals Value Taken Time  BP 123/74 01/23/19 1433  Temp    Pulse 80 01/23/19 1439  Resp 12 01/23/19 1439  SpO2 100 % 01/23/19 1439  Vitals shown include unvalidated device data.  Last Pain:  Vitals:   01/23/19 1201  TempSrc: Oral  PainSc: 6       Patients Stated Pain Goal: 5 ([id number])  Complications: No apparent anesthesia complications

## 2019-01-23 NOTE — Anesthesia Procedure Notes (Signed)
Procedure Name: LMA Insertion Date/Time: 01/23/2019 1:18 PM Performed by: Gwyndolyn Saxon, CRNA Pre-anesthesia Checklist: Patient identified, Emergency Drugs available, Suction available and Patient being monitored Patient Re-evaluated:Patient Re-evaluated prior to induction Oxygen Delivery Method: Circle system utilized Preoxygenation: Pre-oxygenation with 100% oxygen Induction Type: IV induction Ventilation: Mask ventilation without difficulty LMA: LMA inserted LMA Size: 4.0 Number of attempts: 1 Placement Confirmation: positive ETCO2 and breath sounds checked- equal and bilateral Tube secured with: Tape Dental Injury: Teeth and Oropharynx as per pre-operative assessment

## 2019-01-23 NOTE — Op Note (Signed)
I assisted Surgeon(s) and Role:    * Leanora Cover, MD - Primary    * Daryll Brod, MD - Assisting on the Procedure(s): LEFT CARPAL TUNNEL RELEASE LEFT LONG AND TRIGGER RELEASE TRIGGER FINGER/A-1 PULLEY LEFT ULNAR NERVE DECOMPRESSION on 01/23/2019.  I provided assistance on this case as follows:setup, approach of trigger fingers carpal tunnel and cubital tunnel releases. Decompression of the median and ulnar nerves at the wrist and elbow respectively, retraction for the ulnar nerve at the elbow, closure of each of the wounds and application of the dressings, Electronically signed by: Daryll Brod, MD Date: 01/23/2019 Time: 2:29 PM

## 2019-01-23 NOTE — Discharge Instructions (Addendum)
[facility] Instructions Hand Surgery  Wound Care: Keep your hand elevated above the level of your heart.  Do not allow it to dangle by your side.  Keep the dressing dry and do not remove it unless your doctor advises you to do so.  He will usually change it at the time of your post-op visit.  Moving your fingers is advised to stimulate circulation but will depend on the site of your surgery.  If you have a splint applied, your doctor will advise you regarding movement.  Activity: Do not drive or operate machinery today.  Rest today and then you may return to your normal activity and work as indicated by your physician.  Diet:  Drink liquids today or eat a light diet.  You may resume a regular diet tomorrow.    General expectations: Pain for two to three days. Fingers may become slightly swollen.  Call your doctor if any of the following occur: Severe pain not relieved by pain medication. Elevated temperature. Dressing soaked with blood. Inability to move fingers. White or bluish color to fingers.    Post Anesthesia Home Care Instructions  Activity: Get plenty of rest for the remainder of the day. A responsible individual must stay with you for 24 hours following the procedure.  For the next 24 hours, DO NOT: -Drive a car -Paediatric nurse -Drink alcoholic beverages -Take any medication unless instructed by your physician -Make any legal decisions or sign important papers.  Meals: Start with liquid foods such as gelatin or soup. Progress to regular foods as tolerated. Avoid greasy, spicy, heavy foods. If nausea and/or vomiting occur, drink only clear liquids until the nausea and/or vomiting subsides. Call your physician if vomiting continues.  Special Instructions/Symptoms: Your throat may feel dry or sore from the anesthesia or the breathing tube placed in your throat during surgery. If this causes discomfort, gargle with warm salt water. The discomfort should disappear  within 24 hours.  If you had a scopolamine patch placed behind your ear for the management of post- operative nausea and/or vomiting:  1. The medication in the patch is effective for 72 hours, after which it should be removed.  Wrap patch in a tissue and discard in the trash. Wash hands thoroughly with soap and water. 2. You may remove the patch earlier than 72 hours if you experience unpleasant side effects which may include dry mouth, dizziness or visual disturbances. 3. Avoid touching the patch. Wash your hands with soap and water after contact with the patch.  No additional tylenol until after 6:30pm

## 2019-01-23 NOTE — H&P (Signed)
Steve Miller is an 79 y.o. male.   Chief Complaint: Left carpal tunnel, ulnar nerve compression at the elbow, syndrome and trigger digits HPI: 79 year old male with numbness and tingling in the left hand and triggering of the long and ring fingers.  Nocturnal symptoms awaken.  Positive nerve conduction studies.  He wishes to proceed with left carpal tunnel release, ulnar nerve decompression at the elbow, and ring finger trigger releases.  Allergies: No Known Allergies  Past Medical History:  Diagnosis Date  . GERD (gastroesophageal reflux disease)   . Hypertension   . Peripheral neuropathy   . Polycythemia vera ([place])     Past Surgical History:  Procedure Laterality Date  . BACK SURGERY    . JOINT REPLACEMENT Right    knee    Family History: History reviewed. No pertinent family history.  Social History:   reports that he has never smoked. He has never used smokeless tobacco. He reports that he does not drink alcohol or use drugs.  Medications: Medications Prior to Admission  Medication Sig Dispense Refill  . Ascorbic Acid (VITAMIN C PO) Take 1 tablet by mouth daily.    . cholecalciferol (VITAMIN D) 1000 UNITS tablet Take 1,000 Units by mouth daily.    Marland Kitchen docusate sodium (COLACE) 100 MG capsule Take 200 mg by mouth 2 (two) times daily.    Marland Kitchen gabapentin (NEURONTIN) 300 MG capsule Take 300 mg by mouth 2 (two) times daily.    . hydrochlorothiazide (HYDRODIURIL) 25 MG tablet Take 25 mg by mouth daily.    Marland Kitchen ibuprofen (ADVIL) 400 MG tablet Take 400 mg by mouth every 8 (eight) hours as needed.    . Melatonin 3 MG TABS Take 3 mg by mouth at bedtime as needed.    . metoCLOPramide (REGLAN) 10 MG tablet Take 1 tablet (10 mg total) by mouth every 6 (six) hours. 30 tablet 0  . Multiple Vitamin (MULTIVITAMIN WITH MINERALS) TABS Take 1 tablet by mouth daily.    . ruxolitinib phosphate (JAKAFI) 10 MG tablet Take 15 mg by mouth at bedtime.     . ruxolitinib phosphate (JAKAFI) 20 MG tablet Take  15 mg by mouth daily.     . tamsulosin (FLOMAX) 0.4 MG CAPS capsule Take 0.4 mg by mouth daily after supper.    . zolpidem (AMBIEN) 10 MG tablet Take 10 mg by mouth at bedtime as needed for sleep.    Marland Kitchen acetaminophen (TYLENOL) 500 MG tablet Take 1,000 mg by mouth 2 (two) times daily as needed. For pain.    Marland Kitchen omeprazole (PRILOSEC) 40 MG capsule Take 40 mg by mouth daily.    . pantoprazole (PROTONIX) 40 MG tablet Take 1 tablet (40 mg total) by mouth daily. 30 tablet 0    No results found for this or any previous visit (from the past 48 hour(s)).  No results found.   A comprehensive review of systems was negative.  Blood pressure 124/63, pulse (!) 50, temperature (!) 97.4 F (36.3 C), temperature source Oral, resp. rate 20, height 5\' 7"  (1.702 m), weight 81.2 kg, SpO2 100 %.  General appearance: alert, cooperative and appears stated age Head: Normocephalic, without obvious abnormality, atraumatic Neck: supple, symmetrical, trachea midline Cardio: regular rate and rhythm Resp: clear to auscultation bilaterally Extremities: Decreased sensation and intact capillary refill all digits.  +epl/fpl/io.  No wounds.  Pulses: 2+ and symmetric Skin: Skin color, texture, turgor normal. No rashes or lesions Neurologic: Grossly normal Incision/Wound: none  Assessment/Plan Left carpal tunnel syndrome,  ulnar nerve compression at the elbow, long and ring finger trigger digits.Non operative and operative treatment options have been discussed with the patient and patient wishes to proceed with operative treatment. Risks, benefits, and alternatives of surgery have been discussed and the patient agrees with the plan of care.   Leanora Cover 01/23/2019, 12:41 PM

## 2019-01-23 NOTE — Anesthesia Preprocedure Evaluation (Addendum)
Anesthesia Evaluation  Patient identified by MRN, date of birth, ID band Patient awake    Reviewed: Allergy & Precautions, NPO status , Patient's Chart, lab work & pertinent test results  History of Anesthesia Complications Negative for: history of anesthetic complications  Airway Mallampati: II  TM Distance: >3 FB Neck ROM: Full    Dental  (+) Missing,    Pulmonary neg pulmonary ROS,    Pulmonary exam normal        Cardiovascular hypertension, Pt. on medications Normal cardiovascular exam     Neuro/Psych negative neurological ROS  negative psych ROS   GI/Hepatic Neg liver ROS, GERD  Controlled and Medicated,  Endo/Other  negative endocrine ROS  Renal/GU Renal InsufficiencyRenal disease  negative genitourinary   Musculoskeletal negative musculoskeletal ROS (+)   Abdominal   Peds  Hematology negative hematology ROS (+)   Anesthesia Other Findings Day of surgery medications reviewed with patient.  Reproductive/Obstetrics negative OB ROS                            Anesthesia Physical Anesthesia Plan  ASA: II  Anesthesia Plan: General   Post-op Pain Management:    Induction: Intravenous  PONV Risk Score and Plan: 3 and Treatment may vary due to age or medical condition and Ondansetron  Airway Management Planned: LMA  Additional Equipment: None  Intra-op Plan:   Post-operative Plan: Extubation in OR  Informed Consent: I have reviewed the patients History and Physical, chart, labs and discussed the procedure including the risks, benefits and alternatives for the proposed anesthesia with the patient or authorized representative who has indicated his/her understanding and acceptance.     Dental advisory given  Plan Discussed with: CRNA  Anesthesia Plan Comments:        Anesthesia Quick Evaluation

## 2019-01-24 ENCOUNTER — Encounter: Payer: Self-pay | Admitting: *Deleted

## 2019-01-30 ENCOUNTER — Ambulatory Visit: Payer: Non-veteran care

## 2019-02-06 ENCOUNTER — Ambulatory Visit: Payer: No Typology Code available for payment source | Attending: Internal Medicine

## 2019-02-06 DIAGNOSIS — Z23 Encounter for immunization: Secondary | ICD-10-CM | POA: Insufficient documentation

## 2019-02-06 NOTE — Progress Notes (Signed)
   Covid-19 Vaccination Clinic  Name:  Steve Miller    MRN: [id number] DOB: 1940-07-18  02/06/2019  Mr. Steve Miller was observed post Covid-19 immunization for 15 minutes without incidence. He was provided with Vaccine Information Sheet and instruction to access the V-Safe system.   Mr. Steve Miller was instructed to call 911 with any severe reactions post vaccine: Marland Kitchen Difficulty breathing  . Swelling of your face and throat  . A fast heartbeat  . A bad rash all over your body  . Dizziness and weakness    Immunizations Administered    Name Date Dose VIS Date Route   Pfizer COVID-19 Vaccine 02/06/2019  8:24 AM 0.3 mL 12/15/2018 Intramuscular   Manufacturer: [place]   Lot: [id number]   [place]: [id number]

## 2019-02-12 DIAGNOSIS — T8131XA Disruption of external operation (surgical) wound, not elsewhere classified, initial encounter: Secondary | ICD-10-CM | POA: Insufficient documentation

## 2019-05-10 ENCOUNTER — Other Ambulatory Visit: Payer: Self-pay

## 2019-05-10 ENCOUNTER — Ambulatory Visit (INDEPENDENT_AMBULATORY_CARE_PROVIDER_SITE_OTHER): Payer: No Typology Code available for payment source | Admitting: Podiatry

## 2019-05-10 ENCOUNTER — Encounter: Payer: Self-pay | Admitting: Podiatry

## 2019-05-10 ENCOUNTER — Ambulatory Visit (INDEPENDENT_AMBULATORY_CARE_PROVIDER_SITE_OTHER): Payer: Non-veteran care

## 2019-05-10 DIAGNOSIS — B351 Tinea unguium: Secondary | ICD-10-CM | POA: Diagnosis not present

## 2019-05-10 DIAGNOSIS — M722 Plantar fascial fibromatosis: Secondary | ICD-10-CM

## 2019-05-10 DIAGNOSIS — M79675 Pain in left toe(s): Secondary | ICD-10-CM | POA: Diagnosis not present

## 2019-05-10 DIAGNOSIS — M79674 Pain in right toe(s): Secondary | ICD-10-CM | POA: Diagnosis not present

## 2019-05-10 DIAGNOSIS — M79671 Pain in right foot: Secondary | ICD-10-CM

## 2019-05-10 MED ORDER — DICLOFENAC SODIUM 1 % EX GEL: 2.0000 g | Freq: Four times a day (QID) | CUTANEOUS | 2 refills | Status: DC

## 2019-05-10 NOTE — Patient Instructions (Signed)

## 2019-05-16 NOTE — Progress Notes (Signed)
Subjective:   Patient ID: Steve Miller, male   DOB: 80 y.o.   MRN: [id number]   HPI 79 year old male presents the office today for concerns of thick, podiatry that he cannot trim himself.  Also has any pain to the arch of his right foot which is been on for the last 1 month.  Is worsening when he first gets up in his difficulty flexing his foot at times. He thinks that he needs orthotics. He has tried tylenol but no help. It hurts the first step in the morning and gets better with activity. No recent injury or falls. No weakness.   Review of Systems  All other systems reviewed and are negative.  Past Medical History:  Diagnosis Date  . GERD (gastroesophageal reflux disease)   . Hypertension   . Peripheral neuropathy   . Polycythemia vera ([place])     Past Surgical History:  Procedure Laterality Date  . BACK SURGERY    . CARPAL TUNNEL RELEASE Left 01/23/2019   Procedure: LEFT CARPAL TUNNEL RELEASE;  Surgeon: Leanora Cover, MD;  Location: [facility];  Service: Orthopedics;  Laterality: Left;  . JOINT REPLACEMENT Right    knee  . TRIGGER FINGER RELEASE Left 01/23/2019   Procedure: LEFT LONG AND TRIGGER RELEASE TRIGGER FINGER/A-1 PULLEY;  Surgeon: Leanora Cover, MD;  Location: [facility];  Service: Orthopedics;  Laterality: Left;  . ULNAR NERVE TRANSPOSITION Left 01/23/2019   Procedure: LEFT ULNAR NERVE DECOMPRESSION;  Surgeon: Leanora Cover, MD;  Location: [facility];  Service: Orthopedics;  Laterality: Left;     Current Outpatient Medications:  .  Ascorbic Acid (VITAMIN C PO), Take 1 tablet by mouth daily., Disp: , Rfl:  .  cholecalciferol (VITAMIN D) 1000 UNITS tablet, Take 1,000 Units by mouth daily., Disp: , Rfl:  .  diclofenac Sodium (VOLTAREN) 1 % GEL, Apply 2 g topically 4 (four) times daily. Rub into affected area of foot 2 to 4 times daily, Disp: 100 g, Rfl: 2 .  docusate sodium (COLACE) 100 MG capsule, Take 200 mg by mouth 2 (two) times  daily., Disp: , Rfl:  .  gabapentin (NEURONTIN) 300 MG capsule, Take 300 mg by mouth 2 (two) times daily., Disp: , Rfl:  .  hydrochlorothiazide (HYDRODIURIL) 25 MG tablet, Take 25 mg by mouth daily., Disp: , Rfl:  .  HYDROcodone-acetaminophen (NORCO) 5-325 MG tablet, 1-2 tabs po q6 hours prn pain, Disp: 20 tablet, Rfl: 0 .  ibuprofen (ADVIL) 400 MG tablet, Take 400 mg by mouth every 8 (eight) hours as needed., Disp: , Rfl:  .  Melatonin 3 MG TABS, Take 3 mg by mouth at bedtime as needed., Disp: , Rfl:  .  metoCLOPramide (REGLAN) 10 MG tablet, Take 1 tablet (10 mg total) by mouth every 6 (six) hours., Disp: 30 tablet, Rfl: 0 .  Multiple Vitamin (MULTIVITAMIN WITH MINERALS) TABS, Take 1 tablet by mouth daily., Disp: , Rfl:  .  omeprazole (PRILOSEC) 40 MG capsule, Take 40 mg by mouth daily., Disp: , Rfl:  .  pantoprazole (PROTONIX) 40 MG tablet, Take 1 tablet (40 mg total) by mouth daily., Disp: 30 tablet, Rfl: 0 .  ruxolitinib phosphate (JAKAFI) 10 MG tablet, Take 15 mg by mouth at bedtime. , Disp: , Rfl:  .  ruxolitinib phosphate (JAKAFI) 20 MG tablet, Take 15 mg by mouth daily. , Disp: , Rfl:  .  tamsulosin (FLOMAX) 0.4 MG CAPS capsule, Take 0.4 mg by mouth daily after supper., Disp: ,  Rfl:  .  zolpidem (AMBIEN) 10 MG tablet, Take 10 mg by mouth at bedtime as needed for sleep., Disp: , Rfl:   No Known Allergies       Objective:  Physical Exam  General: AAO x3, NAD  Dermatological: Nails are hypertrophic, dystrophic, brittle, discolored, elongated 10. No surrounding redness or drainage. Tenderness nails 1-5 bilaterally. No open lesions or pre-ulcerative lesions are identified today.  Vascular: Dorsalis Pedis artery and Posterior Tibial artery pedal pulses are 2/4 bilateral with immedate capillary fill time. There is no pain with calf compression, swelling, warmth, erythema.   Neruologic: Grossly intact via light touch bilateral.   Musculoskeletal: There is mild tenderness palpation of  the medial band plantar fashion the arch of the foot.  There is no pain with lateral compression of calcaneus there is no area pinpoint tenderness.  Flexor, extensor tendons appear to be intact.  Muscular strength 5/5 in all groups tested bilateral.  Gait: Unassisted, Nonantalgic.       Assessment:   79 year old male with symptomatic onychomycosis, plantar fasciitis    Plan:  -Treatment options discussed including all alternatives, risks, and complications -Etiology of symptoms were discussed -X-rays were obtained and reviewed with the patient.  Bunion is present as well as flatfoot.  No evidence of acute fracture. -Regards to the foot pain dispense power step inserts.  Discussed stretching, icing daily.  We will discuss with his PCP about orthotics at the [place].  Voltaren gel as needed -Debrided nails [id number] without complications or bleeding  Return in about 4 weeks (around 06/07/2019).  Trula Slade DPM

## 2019-06-12 ENCOUNTER — Ambulatory Visit (INDEPENDENT_AMBULATORY_CARE_PROVIDER_SITE_OTHER): Payer: No Typology Code available for payment source | Admitting: Podiatry

## 2019-06-12 ENCOUNTER — Other Ambulatory Visit: Payer: Self-pay

## 2019-06-12 ENCOUNTER — Telehealth: Payer: Self-pay | Admitting: *Deleted

## 2019-06-12 ENCOUNTER — Encounter: Payer: Self-pay | Admitting: Podiatry

## 2019-06-12 DIAGNOSIS — M79671 Pain in right foot: Secondary | ICD-10-CM

## 2019-06-12 DIAGNOSIS — M722 Plantar fascial fibromatosis: Secondary | ICD-10-CM

## 2019-06-12 DIAGNOSIS — M779 Enthesopathy, unspecified: Secondary | ICD-10-CM | POA: Diagnosis not present

## 2019-06-12 NOTE — Telephone Encounter (Signed)
Pt states his pharmacy needs to be changed to [facility].

## 2019-06-13 MED ORDER — DICLOFENAC SODIUM 1 % EX GEL: 2.0000 g | Freq: Four times a day (QID) | CUTANEOUS | 2 refills | Status: AC

## 2019-06-13 NOTE — Telephone Encounter (Signed)
Called and resent the medicine to [facility] on [address]. Steve Miller

## 2019-06-17 NOTE — Progress Notes (Signed)
Subjective: 79 year old male presents the office today for Evaluation of right foot pain, plantar fasciitis.  He thinks that overall he is doing okay.  He has not had significant improvement since last appointment and no recent changes.  Power step inserts been helpful but he is still wanting to do custom orthotics.  He did not get the Voltaren gel.  No recent injury or fall since I last saw him. Denies any systemic complaints such as fevers, chills, nausea, vomiting. No acute changes since last appointment, and no other complaints at this time.   Objective: AAO x3, NAD DP/PT pulses palpable bilaterally, CRT less than 3 seconds There is mild tenderness palpation of the medial band plantar fashion the arch of the foot also mild discomfort the dorsal midfoot.  No area pinpoint tenderness.  No edema, erythema.  Flexor, extensor tendons appear to be intact.  MMT 5/5. No pain with calf compression, swelling, warmth, erythema  Assessment: Left foot capsulitis with plantar fasciitis  Plan: -All treatment options discussed with the patient including all alternatives, risks, complications.  -Prescribed Voltaren gel.  We will follow up on orthotics for him.  Continue wearing supportive shoes and power steps.  Offered steroid injection -Patient encouraged to call the office with any questions, concerns, change in symptoms.   Trula Slade DPM

## 2019-07-24 ENCOUNTER — Encounter: Payer: Self-pay | Admitting: Podiatry

## 2019-07-24 ENCOUNTER — Ambulatory Visit (INDEPENDENT_AMBULATORY_CARE_PROVIDER_SITE_OTHER): Payer: No Typology Code available for payment source | Admitting: Podiatry

## 2019-07-24 ENCOUNTER — Other Ambulatory Visit: Payer: Self-pay

## 2019-07-24 DIAGNOSIS — M779 Enthesopathy, unspecified: Secondary | ICD-10-CM | POA: Diagnosis not present

## 2019-07-24 DIAGNOSIS — M722 Plantar fascial fibromatosis: Secondary | ICD-10-CM | POA: Diagnosis not present

## 2019-07-24 NOTE — Patient Instructions (Signed)

## 2019-07-25 NOTE — Progress Notes (Signed)
Subjective: 79 year old male presents the office today for follow-up evaluation of bilateral foot pain.  Left heel the plantar fasciitis is doing better management was helping originally but starting to come back.  He also states the injection was helping.  Still gets some discomfort to the top of the right foot.  No recent injury or falls.  No swelling or redness.  No open lesions. Denies any systemic complaints such as fevers, chills, nausea, vomiting. No acute changes since last appointment, and no other complaints at this time.   Objective: AAO x3, NAD DP/PT pulses palpable bilaterally, CRT less than 3 seconds On the dorsal medial aspect of right foot is respiratory discomfort is no edema, erythema.  Flexor, extensor tendons appear to be intact.  Minimal discomfort in the plantar fashion the left side. No open lesions or pre-ulcerative lesions.  No pain with calf compression, swelling, warmth, erythema  Assessment: Right midfoot pain, capsulitis/tendonitis; left foot plantar fasciitis  Plan: -All treatment options discussed with the patient including all alternatives, risks, complications.  -Steroid injection performed to the dorsal medial aspect of right foot.  Skin was done with alcohol and a mixture of 1 cc, 10, 0.5 cc of Marcaine plain, 0.5 cc of Marcaine plain was infiltrated into the area of maximal tenderness with care to avoid any tenderness or neurovascular structures.  Postinjection care discussed. -Awaiting orthotics.  We previously sent this over the [place].  I given prescription for [facility] today. -Patient encouraged to call the office with any questions, concerns, change in symptoms.   Trula Slade DPM

## 2019-08-14 ENCOUNTER — Ambulatory Visit (INDEPENDENT_AMBULATORY_CARE_PROVIDER_SITE_OTHER): Payer: No Typology Code available for payment source | Admitting: Podiatry

## 2019-08-14 ENCOUNTER — Other Ambulatory Visit: Payer: Self-pay

## 2019-08-14 DIAGNOSIS — M79671 Pain in right foot: Secondary | ICD-10-CM

## 2019-08-14 DIAGNOSIS — M722 Plantar fascial fibromatosis: Secondary | ICD-10-CM

## 2019-08-14 DIAGNOSIS — M779 Enthesopathy, unspecified: Secondary | ICD-10-CM

## 2019-08-14 NOTE — Progress Notes (Signed)
Subjective: 79 year old male presents the office today for follow-up evaluation of right foot pain.  He states that the pain has moved and he points more to the distal aspect of the foot just proximal to the first MPJ.  He is in pain in the bottom of his foot as well as the top particularly dorsiflexes his ankle and then just big toe.  No recent injury or falls or any changes.  He has been having difficulty getting orthotics and he needs to have a prescription by the [facility] medical doctor.  He has a follow-up with him on Friday.  No recent injury or falls in the last saw him.  No changes otherwise.  Objective: AAO x3, NAD DP/PT pulses palpable bilaterally, CRT less than 3 seconds Tenderness on the medial band plantar fashion distally near the insertion but also along the first MPJ upon dorsiflexion of the hallux and ankle.  There is no area pinpoint tenderness there is no edema, erythema.  Flexor, extensor tendons appear to be intact.  MMT 5/5. No open lesions or pre-ulcerative lesions.  No pain with calf compression, swelling, warmth, erythema  Assessment: Bilateral foot pain right side worse than left, plantar fasciitis/capsulitis  Plan: -All treatment options discussed with the patient including all alternatives, risks, complications.  -Steroid injection performed to the medial aspect of right forefoot proximal to the 1st MTPJ.  Skin was prepped with alcohol and a mixture of 1 cc, 10, 0.5 cc of Marcaine plain, 0.5 cc of Marcaine plain was infiltrated into the area of maximal tenderness with care to avoid any tenderness or neurovascular structures.  Postinjection care discussed. -Awaiting orthotics.  He needs to have a prescription by his [place].  Is a follow-up with him on Friday. -Pain seems to be moving however he continues have pain will recommend MRI or possible physical therapy.  If we do physical therapy will again need a referral from the [place] for this. -Patient encouraged to call  the office with any questions, concerns, change in symptoms.   Trula Slade DPM

## 2019-09-04 ENCOUNTER — Ambulatory Visit: Payer: Medicare Other | Admitting: Podiatry

## 2019-09-11 ENCOUNTER — Ambulatory Visit: Payer: Self-pay | Attending: Internal Medicine

## 2019-09-11 DIAGNOSIS — Z23 Encounter for immunization: Secondary | ICD-10-CM

## 2019-09-11 NOTE — Progress Notes (Signed)
   Covid-19 Vaccination Clinic  Name:  Jobie Popp    MRN: [id number] DOB: [birth year]  09/11/2019  Mr. Lawless was observed post Covid-19 immunization for 15 minutes without incident. He was provided with Vaccine Information Sheet and instruction to access the V-Safe system.   Mr. Geter was instructed to call 911 with any severe reactions post vaccine: Marland Kitchen Difficulty breathing  . Swelling of face and throat  . A fast heartbeat  . A bad rash all over body  . Dizziness and weakness

## 2020-05-08 ENCOUNTER — Ambulatory Visit: Payer: Medicare Other

## 2020-05-08 ENCOUNTER — Ambulatory Visit (INDEPENDENT_AMBULATORY_CARE_PROVIDER_SITE_OTHER): Payer: No Typology Code available for payment source

## 2020-05-08 ENCOUNTER — Other Ambulatory Visit: Payer: Self-pay

## 2020-05-08 ENCOUNTER — Ambulatory Visit (INDEPENDENT_AMBULATORY_CARE_PROVIDER_SITE_OTHER): Payer: No Typology Code available for payment source | Admitting: Podiatry

## 2020-05-08 DIAGNOSIS — M2142 Flat foot [pes planus] (acquired), left foot: Secondary | ICD-10-CM

## 2020-05-08 DIAGNOSIS — M2141 Flat foot [pes planus] (acquired), right foot: Secondary | ICD-10-CM

## 2020-05-08 DIAGNOSIS — M7752 Other enthesopathy of left foot: Secondary | ICD-10-CM | POA: Diagnosis not present

## 2020-05-08 DIAGNOSIS — M722 Plantar fascial fibromatosis: Secondary | ICD-10-CM

## 2020-05-08 MED ORDER — TRIAMCINOLONE ACETONIDE 10 MG/ML IJ SUSP
10.0000 mg | Freq: Once | INTRAMUSCULAR | Status: AC
  Administered 2020-05-08: 10 mg

## 2020-05-14 NOTE — Progress Notes (Signed)
Subjective: 80 year old male presents to the office with new concerns today of pain to his left foot and ankle.  He points to the anterior medial aspect of the ankle he gets majority discomfort.  He denies recent injury or trauma to the area.  This is been getting worse over time although pain is intermittent but becoming more consistent.  He said no recent treatment.  No increase in swelling. Denies any systemic complaints such as fevers, chills, nausea, vomiting. No acute changes since last appointment, and no other complaints at this time.   Objective: AAO x3, NAD DP/PT pulses palpable bilaterally, CRT less than 3 seconds Tenderness mostly along the left ankle in the anterior medial aspect of the joint.  Also mild discomfort on the tibialis anterior tendon.  Ankle, subtalar joint, midtarsal range of motion intact but any restrictions.  Minimal edema.  No erythema or warmth.  No area of pinpoint tenderness.  No proximal tib-fib pain.  Extensor, flexor tendons appear to be intact.  Flatfoot, bunion present. No pain with calf compression, swelling, warmth, erythema  Assessment: Left ankle capsulitis/tendonitis   Plan: -All treatment options discussed with the patient including all alternatives, risks, complications.  -X-rays obtained reviewed.  No evidence of acute fracture identified.  Flatfoot, bunion noted. -Steroid injection performed.  See procedure note below. -Tri-Lock ankle brace dispensed -Patient encouraged to call the office with any questions, concerns, change in symptoms.   Procedure: Injection Intermediate Joint Discussed alternatives, risks, complications and verbal consent was obtained.  Location: Left anterior medial ankle Skin Prep: Betadine. Injectate: 0.5cc 0.5% marcaine plain, 0.5 cc 2% lidocaine plain and, 1 cc kenalog 10. Disposition: Patient tolerated procedure well. Injection site dressed with a band-aid.  Post-injection care was discussed and return precautions  discussed.  Care was taken not to infiltrate around the tendon and directly into the joint.  Trula Slade DPM

## 2020-06-05 ENCOUNTER — Ambulatory Visit: Payer: No Typology Code available for payment source | Admitting: Podiatry

## 2020-06-19 ENCOUNTER — Ambulatory Visit (INDEPENDENT_AMBULATORY_CARE_PROVIDER_SITE_OTHER): Payer: No Typology Code available for payment source | Admitting: Podiatry

## 2020-06-19 ENCOUNTER — Other Ambulatory Visit: Payer: Self-pay

## 2020-06-19 DIAGNOSIS — G8929 Other chronic pain: Secondary | ICD-10-CM

## 2020-06-19 DIAGNOSIS — M778 Other enthesopathies, not elsewhere classified: Secondary | ICD-10-CM

## 2020-06-19 DIAGNOSIS — M2141 Flat foot [pes planus] (acquired), right foot: Secondary | ICD-10-CM

## 2020-06-19 DIAGNOSIS — M2142 Flat foot [pes planus] (acquired), left foot: Secondary | ICD-10-CM

## 2020-06-19 DIAGNOSIS — M722 Plantar fascial fibromatosis: Secondary | ICD-10-CM

## 2020-06-19 DIAGNOSIS — M79673 Pain in unspecified foot: Secondary | ICD-10-CM

## 2020-06-19 MED ORDER — TRIAMCINOLONE ACETONIDE 10 MG/ML IJ SUSP
10.0000 mg | Freq: Once | INTRAMUSCULAR | Status: AC
  Administered 2020-06-19: 10 mg

## 2020-06-23 NOTE — Progress Notes (Signed)
Subjective: 80 year old male presents the office today for follow-up evaluation of left ankle, foot pain.  States the injection helped significantly but the pain has moved some and proximal dorsal medial aspect of foot along the dorsal talonavicular joint.  No recent injury.  Asking for new prescription of orthotics. Denies any systemic complaints such as fevers, chills, nausea, vomiting. No acute changes since last appointment, and no other complaints at this time.   Objective: AAO x3, NAD DP/PT pulses palpable bilaterally, CRT less than 3 seconds On the left side there is tenderness palpation of the dorsal aspect along the talonavicular joint.  No significant pain to ankle joint today.  Flatfoot is present.  MMT 5/5.  No pain with calf compression, swelling, warmth, erythema  Assessment: Capsulitis left foot  Plan: -All treatment options discussed with the patient including all alternatives, risks, complications.  -Prescription for new orthotics were provided for [facility] today. -Steroid injection performed today including dorsal talonavicular joint.  Care was taken to infiltrate away from tendon structures.  The skin was prepped with Betadine/alcohol and a mixture of 1 cc Kenalog 10, 1 cc lidocaine plain was infiltrated into the area of maximal tenderness without complications.  Postinjection care discussed. -Patient encouraged to call the office with any questions, concerns, change in symptoms.   Trula Slade DPM

## 2020-11-07 ENCOUNTER — Ambulatory Visit (HOSPITAL_COMMUNITY)
Admission: EM | Admit: 2020-11-07 | Discharge: 2020-11-07 | Disposition: A | Discharge status: Home / Self Care | Payer: No Typology Code available for payment source | Attending: Physician Assistant | Admitting: Physician Assistant

## 2020-11-07 ENCOUNTER — Other Ambulatory Visit: Payer: Self-pay

## 2020-11-07 ENCOUNTER — Encounter (HOSPITAL_COMMUNITY): Payer: Self-pay

## 2020-11-07 DIAGNOSIS — G5601 Carpal tunnel syndrome, right upper limb: Secondary | ICD-10-CM

## 2020-11-07 DIAGNOSIS — M79641 Pain in right hand: Secondary | ICD-10-CM | POA: Diagnosis not present

## 2020-11-07 MED ORDER — PREDNISONE 10 MG PO TABS: 30.0000 mg | ORAL_TABLET | Freq: Every day | ORAL | 0 refills | Status: AC

## 2020-11-07 MED ORDER — PREDNISONE 10 MG PO TABS: 30.0000 mg | ORAL_TABLET | Freq: Every day | ORAL | 0 refills | Status: DC

## 2020-11-07 NOTE — Discharge Instructions (Signed)
Please use wrist brace as much as possible particularly at night to help with your symptoms.  You can restart gabapentin at night and if you continue to have symptoms but have improved with this medication at second dose 4 to 5 days later.  We are going to prescribe prednisone to help with inflammation.  Do not take NSAIDs including aspirin, ibuprofen/Advil, naproxen/Aleve with this medication as it can cause stomach bleeding.  You can use Tylenol.  Make sure to follow-up with your hand surgeon for further evaluation and management as we discussed.

## 2020-11-07 NOTE — ED Provider Notes (Signed)
[place]    CSN: [id number] Arrival date & time: 11/07/20  0935      History   Chief Complaint Chief Complaint  Patient presents with   Hand Pain    HPI Steve Miller is a 80 y.o. male.   Patient presents today with a several day history of worsening carpal tunnel in his right hand.  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  He believes that he exacerbated carpal tunnel in the right hand by working out with dumbbells and has had ongoing pain that is worsened since that time.  Pain is rated 10 on a 0-10 pain scale, localized to first 3 fingers of right hand with radiation into her wrist, described as numbness and extreme tingling sensation.  He has tried numerous over-the-counter medications without improvement of symptoms.  He is followed by hand surgeon and has an appointment on 11/12/2020 but was unable to be seen with acute symptoms.  He denies any weakness.  He is right-handed.   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   Acquired trigger finger 10/20/2018   Primary osteoarthritis of left hand 10/20/2018   Plantar fasciitis 08/03/2016    Past Surgical History:  Procedure Laterality Date   BACK SURGERY     CARPAL TUNNEL RELEASE Left 01/23/2019   Procedure: LEFT CARPAL TUNNEL RELEASE;  Surgeon: Leanora Cover, MD;  Location: [facility] [place];  Service: Orthopedics;  Laterality: Left;   JOINT REPLACEMENT Right    knee   TRIGGER FINGER RELEASE Left 01/23/2019   Procedure: LEFT LONG AND TRIGGER RELEASE TRIGGER FINGER/A-1 PULLEY;  Surgeon: Leanora Cover, MD;  Location: [place];  Service: Orthopedics;  Laterality: Left;   ULNAR NERVE TRANSPOSITION Left 01/23/2019   Procedure: LEFT ULNAR NERVE DECOMPRESSION;  Surgeon: Leanora Cover, MD;  Location: [place];  Service: Orthopedics;  Laterality: Left;       Home Medications    Prior to Admission medications   Medication Sig Start Date End Date Taking? Authorizing Provider  Ascorbic Acid (VITAMIN C PO) Take 1 tablet by mouth daily.    [provider]  cholecalciferol (VITAMIN D) 1000 UNITS tablet Take 1,000 Units by mouth daily.    [provider]  diclofenac Sodium (VOLTAREN) 1 % GEL Apply 2 g topically 4 (four) times daily. Rub into affected area of foot 2 to 4 times daily 06/13/19   Trula Slade, DPM  docusate sodium (COLACE) 100 MG capsule Take 200 mg by mouth 2 (two) times daily.    [provider]  gabapentin (NEURONTIN) 300 MG capsule Take 300 mg by mouth 2 (two) times daily.    [provider]  hydrochlorothiazide (HYDRODIURIL) 25 MG tablet Take 25 mg by mouth daily.    [provider]  HYDROcodone-acetaminophen (NORCO) 5-325 MG tablet 1-2 tabs po q6 hours prn pain 01/23/19   Leanora Cover, MD  Melatonin 3 MG TABS Take 3 mg by mouth at bedtime as needed.    [provider]  metoCLOPramide (REGLAN)  10 MG tablet Take 1 tablet (10 mg total) by mouth every 6 (six) hours. 05/23/16   Isla Pence, MD  Multiple Vitamin (MULTIVITAMIN WITH MINERALS) TABS Take 1 tablet by mouth daily.    [provider]  omeprazole (PRILOSEC) 40 MG capsule Take 40 mg by mouth daily.    [provider]  pantoprazole (PROTONIX) 40 MG tablet Take 1 tablet (40 mg total) by mouth daily. 07/30/11 07/29/12  Virgel Manifold, MD  predniSONE (DELTASONE) 10 MG tablet Take 3 tablets (30 mg total) by mouth daily for 4 days. 11/07/20 11/11/20  Ina Poupard, Derry Skill, PA-C  ruxolitinib phosphate (JAKAFI) 10 MG tablet Take 15 mg  by mouth at bedtime.     [provider]  ruxolitinib phosphate (JAKAFI) 20 MG tablet Take 15 mg by mouth daily.     [provider]  tamsulosin (FLOMAX) 0.4 MG CAPS capsule Take 0.4 mg by mouth daily after supper.    [provider]  zolpidem (AMBIEN) 10 MG tablet Take 10 mg by mouth at bedtime as needed for sleep.    [provider]    Family History History reviewed. No pertinent family history.  Social History Social History   Tobacco Use   Smoking status: Never   Smokeless tobacco: Never  Vaping Use   Vaping Use: Never used  Substance Use Topics   Alcohol use: No   Drug use: No     Allergies   Metoclopramide and Nifedipine   Review of Systems Review of Systems  Constitutional:  Positive for activity change. Negative for appetite change, fatigue and fever.  Respiratory:  Negative for cough and shortness of breath.   Cardiovascular:  Negative for chest pain.  Gastrointestinal:  Negative for abdominal pain, diarrhea, nausea and vomiting.  Musculoskeletal:  Positive for arthralgias and myalgias.  Neurological:  Positive for numbness. Negative for dizziness, weakness, light-headedness and headaches.    Physical Exam Triage Vital Signs ED Triage Vitals  Enc Vitals Group     BP 11/07/20 1137 (!) 144/79     Pulse Rate 11/07/20 1137 60     Resp 11/07/20 1137 19     Temp 11/07/20 1137 98.2 F (36.8 C)     Temp Source 11/07/20 1137 Oral     SpO2 11/07/20 1137 98 %     Weight --      Height --      Head Circumference --      Peak Flow --      Pain Score 11/07/20 1136 10     Pain Loc --      Pain Edu? --      Excl. in [place]? --    No data found.  Updated Vital Signs BP (!) 144/79 (BP Location: Right Arm)   Pulse 60   Temp 98.2 F (36.8 C) (Oral)   Resp 19   SpO2 98%   Visual Acuity Right Eye Distance:   Left Eye Distance:   Bilateral Distance:    Right Eye Near:   Left Eye Near:    Bilateral Near:     Physical  Exam Vitals reviewed.  Constitutional:      General: He is awake.     Appearance: Normal appearance. He is well-developed. He is not ill-appearing.     Comments: Very pleasant male appears stated age in no acute distress sitting comfortably in exam room  HENT:     Head: Normocephalic and atraumatic.  Cardiovascular:     Rate and Rhythm:  Normal rate and regular rhythm.     Pulses:          Radial pulses are 2+ on the right side and 2+ on the left side.     Heart sounds: Normal heart sounds, S1 normal and S2 normal. No murmur heard.    Comments: Capillary refill within 2 seconds bilateral fingers Pulmonary:     Effort: Pulmonary effort is normal.     Breath sounds: Normal breath sounds. No stridor. No wheezing, rhonchi or rales.     Comments: Clear to auscultation bilaterally Musculoskeletal:     Right hand: No swelling or tenderness. Normal range of motion. Normal sensation. There is no disruption of two-point discrimination. Normal capillary refill.     Comments: Right hand: Normal active range of motion and pincer/grip strength.  Hand neurovascularly intact.  Positive Tinel and Phalen's.  Neurological:     Mental Status: He is alert.  Psychiatric:        Behavior: Behavior is cooperative.     UC Treatments / Results  Labs (all labs ordered are listed, but only abnormal results are displayed) Labs Reviewed - No data to display  EKG   Radiology No results found.  Procedures Procedures (including critical care time)  Medications Ordered in UC Medications - No data to display  Initial Impression / Assessment and Plan / UC Course  I have reviewed the triage vital signs and the nursing notes.  Pertinent labs & imaging results that were available during my care of the patient were reviewed by me and considered in my medical decision making (see chart for details).     Patient requested injection of cortisone to manage symptoms.  Discussed that we are not able to do this  in urgent care but will start systemic steroids to help manage symptoms.  He denies history of diabetes.  Was prescribed prednisone 30 mg for 4 days with instruction not to take NSAIDs with this medication due to risk of GI bleeding.  Patient has gabapentin at home and will restart this medication at night with instruction to increase dose after 4 to 5 days if symptoms persist.  He was given a wrist brace and instructed to use this regularly with particularly at night.  Discussed the importance of avoiding repetitive movements.  He has follow-up appointment scheduled with hand surgeon and was strongly encouraged to keep this appointment for ongoing management.  Discussed alarm symptoms that warrant emergent evaluation.  Strict return precautions given to which she expressed understanding.  Final Clinical Impressions(s) / UC Diagnoses   Final diagnoses:  Right hand pain  Carpal tunnel syndrome of right wrist     Discharge Instructions      Please use wrist brace as much as possible particularly at night to help with your symptoms.  You can restart gabapentin at night and if you continue to have symptoms but have improved with this medication at second dose 4 to 5 days later.  We are going to prescribe prednisone to help with inflammation.  Do not take NSAIDs including aspirin, ibuprofen/Advil, naproxen/Aleve with this medication as it can cause stomach bleeding.  You can use Tylenol.  Make sure to follow-up with your hand surgeon for further evaluation and management as we discussed.     ED Prescriptions     Medication Sig Dispense Auth. Provider   predniSONE (DELTASONE) 10 MG tablet  (Status: Discontinued) Take 3 tablets (30 mg total) by mouth daily for 4 days. 12 tablet Kody Vigil, Junie Panning  K, PA-C   predniSONE (DELTASONE) 10 MG tablet Take 3 tablets (30 mg total) by mouth daily for 4 days. 12 tablet Kamylle Axelson, Derry Skill, PA-C      PDMP not reviewed this encounter.   Terrilee Croak, PA-C 11/07/20  1222

## 2020-11-07 NOTE — ED Triage Notes (Signed)
Pt presents with R hand pain. States he is going to have hand surgery soon. States his hand feels numb.

## 2020-11-12 ENCOUNTER — Other Ambulatory Visit: Payer: Self-pay | Admitting: Orthopedic Surgery

## 2020-12-01 ENCOUNTER — Encounter (HOSPITAL_BASED_OUTPATIENT_CLINIC_OR_DEPARTMENT_OTHER): Payer: Self-pay | Admitting: Orthopedic Surgery

## 2020-12-01 ENCOUNTER — Other Ambulatory Visit: Payer: Self-pay

## 2020-12-04 ENCOUNTER — Encounter (HOSPITAL_BASED_OUTPATIENT_CLINIC_OR_DEPARTMENT_OTHER)
Admission: RE | Admit: 2020-12-04 | Discharge: 2020-12-04 | Disposition: A | Discharge status: Home / Self Care | Payer: No Typology Code available for payment source | Source: Ambulatory Visit | Attending: Orthopedic Surgery | Admitting: Orthopedic Surgery

## 2020-12-04 DIAGNOSIS — Z01818 Encounter for other preprocedural examination: Secondary | ICD-10-CM | POA: Diagnosis present

## 2020-12-04 DIAGNOSIS — I10 Essential (primary) hypertension: Secondary | ICD-10-CM | POA: Insufficient documentation

## 2020-12-04 LAB — BASIC METABOLIC PANEL
Anion gap: 9 (ref 5–15)
BUN: 12 mg/dL (ref 8–23)
CO2: 26 mmol/L (ref 22–32)
Calcium: 9 mg/dL (ref 8.9–10.3)
Chloride: 105 mmol/L (ref 98–111)
Creatinine, Ser: 1.25 mg/dL — ABNORMAL HIGH (ref 0.61–1.24)
GFR, Estimated: 59 mL/min — ABNORMAL LOW (ref >60)
Glucose, Bld: 77 mg/dL (ref 70–99)
Potassium: 3.8 mmol/L (ref 3.5–5.1)
Sodium: 140 mmol/L (ref 135–145)

## 2020-12-04 NOTE — Progress Notes (Signed)

## 2020-12-04 NOTE — Progress Notes (Signed)
EKG reviewed with Dr. Sabra Heck, will proceed with surgery as scheduled.

## 2020-12-08 ENCOUNTER — Ambulatory Visit (HOSPITAL_BASED_OUTPATIENT_CLINIC_OR_DEPARTMENT_OTHER): Payer: No Typology Code available for payment source | Admitting: Anesthesiology

## 2020-12-08 ENCOUNTER — Ambulatory Visit (HOSPITAL_BASED_OUTPATIENT_CLINIC_OR_DEPARTMENT_OTHER)
Admission: RE | Admit: 2020-12-08 | Discharge: 2020-12-08 | Disposition: A | Discharge status: Home / Self Care | Payer: No Typology Code available for payment source | Attending: Orthopedic Surgery | Admitting: Orthopedic Surgery

## 2020-12-08 ENCOUNTER — Other Ambulatory Visit: Payer: Self-pay

## 2020-12-08 ENCOUNTER — Encounter (HOSPITAL_BASED_OUTPATIENT_CLINIC_OR_DEPARTMENT_OTHER): Payer: Self-pay | Admitting: Orthopedic Surgery

## 2020-12-08 ENCOUNTER — Encounter (HOSPITAL_BASED_OUTPATIENT_CLINIC_OR_DEPARTMENT_OTHER)
Admission: RE | Disposition: A | Discharge status: Home / Self Care | Payer: Self-pay | Source: Home / Self Care | Attending: Orthopedic Surgery

## 2020-12-08 DIAGNOSIS — I452 Bifascicular block: Secondary | ICD-10-CM | POA: Diagnosis not present

## 2020-12-08 DIAGNOSIS — Z79899 Other long term (current) drug therapy: Secondary | ICD-10-CM | POA: Insufficient documentation

## 2020-12-08 DIAGNOSIS — I10 Essential (primary) hypertension: Secondary | ICD-10-CM | POA: Insufficient documentation

## 2020-12-08 DIAGNOSIS — M65351 Trigger finger, right little finger: Secondary | ICD-10-CM | POA: Diagnosis not present

## 2020-12-08 DIAGNOSIS — K219 Gastro-esophageal reflux disease without esophagitis: Secondary | ICD-10-CM | POA: Diagnosis not present

## 2020-12-08 DIAGNOSIS — G5601 Carpal tunnel syndrome, right upper limb: Secondary | ICD-10-CM | POA: Insufficient documentation

## 2020-12-08 DIAGNOSIS — D45 Polycythemia vera: Secondary | ICD-10-CM | POA: Diagnosis not present

## 2020-12-08 DIAGNOSIS — I4510 Unspecified right bundle-branch block: Secondary | ICD-10-CM | POA: Diagnosis not present

## 2020-12-08 DIAGNOSIS — M67441 Ganglion, right hand: Secondary | ICD-10-CM | POA: Diagnosis not present

## 2020-12-08 HISTORY — PX: CARPAL TUNNEL RELEASE: SHX101

## 2020-12-08 HISTORY — PX: CYST EXCISION: SHX5701

## 2020-12-08 HISTORY — PX: TRIGGER FINGER RELEASE: SHX641

## 2020-12-08 SURGERY — CARPAL TUNNEL RELEASE
Anesthesia: Regional | Site: Hand | Laterality: Right

## 2020-12-08 MED ORDER — OXYCODONE HCL 5 MG PO TABS: 5.0000 mg | ORAL_TABLET | Freq: Once | ORAL | Status: DC | PRN

## 2020-12-08 MED ORDER — FENTANYL CITRATE (PF) 100 MCG/2ML IJ SOLN: 25.0000 ug | INTRAMUSCULAR | Status: DC | PRN

## 2020-12-08 MED ORDER — FENTANYL CITRATE (PF) 100 MCG/2ML IJ SOLN
INTRAMUSCULAR | Status: AC
  Filled 2020-12-08: qty 2

## 2020-12-08 MED ORDER — OXYCODONE HCL 5 MG/5ML PO SOLN: 5.0000 mg | Freq: Once | ORAL | Status: DC | PRN

## 2020-12-08 MED ORDER — PROPOFOL 500 MG/50ML IV EMUL
INTRAVENOUS | Status: DC | PRN
  Administered 2020-12-08: 50 ug/kg/min via INTRAVENOUS

## 2020-12-08 MED ORDER — FENTANYL CITRATE (PF) 100 MCG/2ML IJ SOLN
INTRAMUSCULAR | Status: DC | PRN
  Administered 2020-12-08: 50 ug via INTRAVENOUS

## 2020-12-08 MED ORDER — ACETAMINOPHEN 500 MG PO TABS
ORAL_TABLET | ORAL | Status: AC
  Filled 2020-12-08: qty 2

## 2020-12-08 MED ORDER — ACETAMINOPHEN 500 MG PO TABS
1000.0000 mg | ORAL_TABLET | Freq: Once | ORAL | Status: AC
  Administered 2020-12-08: 1000 mg via ORAL

## 2020-12-08 MED ORDER — LIDOCAINE HCL (PF) 0.5 % IJ SOLN
INTRAMUSCULAR | Status: DC | PRN
  Administered 2020-12-08: 35 mL via INTRAVENOUS

## 2020-12-08 MED ORDER — ONDANSETRON HCL 4 MG/2ML IJ SOLN
INTRAMUSCULAR | Status: DC | PRN
  Administered 2020-12-08: 4 mg via INTRAVENOUS

## 2020-12-08 MED ORDER — BUPIVACAINE HCL (PF) 0.25 % IJ SOLN
INTRAMUSCULAR | Status: DC | PRN
  Administered 2020-12-08: 10 mL

## 2020-12-08 MED ORDER — HYDROCODONE-ACETAMINOPHEN 5-325 MG PO TABS: ORAL_TABLET | ORAL | 0 refills | Status: AC

## 2020-12-08 MED ORDER — BUPIVACAINE HCL (PF) 0.25 % IJ SOLN
INTRAMUSCULAR | Status: AC
  Filled 2020-12-08: qty 30

## 2020-12-08 MED ORDER — CEFAZOLIN SODIUM-DEXTROSE 2-4 GM/100ML-% IV SOLN
2.0000 g | INTRAVENOUS | Status: AC
  Administered 2020-12-08: 2 g via INTRAVENOUS

## 2020-12-08 MED ORDER — CEFAZOLIN SODIUM-DEXTROSE 2-4 GM/100ML-% IV SOLN
INTRAVENOUS | Status: AC
  Filled 2020-12-08: qty 100

## 2020-12-08 MED ORDER — 0.9 % SODIUM CHLORIDE (POUR BTL) OPTIME
TOPICAL | Status: DC | PRN
  Administered 2020-12-08: 80 mL

## 2020-12-08 MED ORDER — LACTATED RINGERS IV SOLN: INTRAVENOUS | Status: DC

## 2020-12-08 SURGICAL SUPPLY — 55 items
APL PRP STRL LF DISP 70% ISPRP (MISCELLANEOUS) ×2
APL SKNCLS STERI-STRIP NONHPOA (GAUZE/BANDAGES/DRESSINGS)
BENZOIN TINCTURE PRP APPL 2/3 (GAUZE/BANDAGES/DRESSINGS) IMPLANT
BLADE MINI RND TIP GREEN BEAV (BLADE) IMPLANT
BLADE SURG 15 STRL LF DISP TIS (BLADE) ×4 IMPLANT
BLADE SURG 15 STRL SS (BLADE) ×6
BNDG CMPR 5X2 CHSV 1 LYR STRL (GAUZE/BANDAGES/DRESSINGS)
BNDG CMPR 9X4 STRL LF SNTH (GAUZE/BANDAGES/DRESSINGS) ×2
BNDG COHESIVE 1X5 TAN STRL LF (GAUZE/BANDAGES/DRESSINGS) IMPLANT
BNDG COHESIVE 2X5 TAN ST LF (GAUZE/BANDAGES/DRESSINGS) IMPLANT
BNDG CONFORM 2 STRL LF (GAUZE/BANDAGES/DRESSINGS) IMPLANT
BNDG ELASTIC 2X5.8 VLCR STR LF (GAUZE/BANDAGES/DRESSINGS) IMPLANT
BNDG ELASTIC 3X5.8 VLCR STR LF (GAUZE/BANDAGES/DRESSINGS) ×3 IMPLANT
BNDG ESMARK 4X9 LF (GAUZE/BANDAGES/DRESSINGS) ×3 IMPLANT
BNDG GAUZE 1X2.1 STRL (MISCELLANEOUS) IMPLANT
BNDG GAUZE ELAST 4 BULKY (GAUZE/BANDAGES/DRESSINGS) ×3 IMPLANT
BNDG PLASTER X FAST 3X3 WHT LF (CAST SUPPLIES) IMPLANT
BNDG PLSTR 9X3 FST ST WHT (CAST SUPPLIES)
CHLORAPREP W/TINT 26 (MISCELLANEOUS) ×3 IMPLANT
CORD BIPOLAR FORCEPS 12FT (ELECTRODE) ×3 IMPLANT
COVER BACK TABLE 60X90IN (DRAPES) ×3 IMPLANT
COVER MAYO STAND STRL (DRAPES) ×3 IMPLANT
CUFF TOURN SGL QUICK 18X4 (TOURNIQUET CUFF) IMPLANT
DRAPE EXTREMITY T 121X128X90 (DISPOSABLE) ×3 IMPLANT
DRAPE SURG 17X23 STRL (DRAPES) IMPLANT
DRAPE U-SHAPE 47X51 STRL (DRAPES) ×3 IMPLANT
DRSG PAD ABDOMINAL 8X10 ST (GAUZE/BANDAGES/DRESSINGS) ×3 IMPLANT
GAUZE SPONGE 4X4 12PLY STRL (GAUZE/BANDAGES/DRESSINGS) ×3 IMPLANT
GAUZE XEROFORM 1X8 LF (GAUZE/BANDAGES/DRESSINGS) ×3 IMPLANT
GLOVE SRG 8 PF TXTR STRL LF DI (GLOVE) ×2 IMPLANT
GLOVE SURG ENC MOIS LTX SZ6.5 (GLOVE) ×3 IMPLANT
GLOVE SURG ENC MOIS LTX SZ7.5 (GLOVE) ×3 IMPLANT
GLOVE SURG POLYISO LF SZ6.5 (GLOVE) ×3 IMPLANT
GLOVE SURG UNDER POLY LF SZ7 (GLOVE) ×9 IMPLANT
GLOVE SURG UNDER POLY LF SZ8 (GLOVE) ×3
GOWN STRL REUS W/ TWL LRG LVL3 (GOWN DISPOSABLE) ×4 IMPLANT
GOWN STRL REUS W/TWL LRG LVL3 (GOWN DISPOSABLE) ×6
GOWN STRL REUS W/TWL XL LVL3 (GOWN DISPOSABLE) ×3 IMPLANT
NEEDLE HYPO 25X1 1.5 SAFETY (NEEDLE) ×3 IMPLANT
NS IRRIG 1000ML POUR BTL (IV SOLUTION) ×3 IMPLANT
PACK BASIN DAY SURGERY FS (CUSTOM PROCEDURE TRAY) ×3 IMPLANT
PAD CAST 3X4 CTTN HI CHSV (CAST SUPPLIES) IMPLANT
PAD CAST 4YDX4 CTTN HI CHSV (CAST SUPPLIES) IMPLANT
PADDING CAST ABS 4INX4YD NS (CAST SUPPLIES)
PADDING CAST ABS COTTON 4X4 ST (CAST SUPPLIES) IMPLANT
PADDING CAST COTTON 3X4 STRL (CAST SUPPLIES)
PADDING CAST COTTON 4X4 STRL (CAST SUPPLIES)
STOCKINETTE 4X48 STRL (DRAPES) ×3 IMPLANT
STRIP CLOSURE SKIN 1/2X4 (GAUZE/BANDAGES/DRESSINGS) IMPLANT
SUT ETHILON 3 0 PS 1 (SUTURE) IMPLANT
SUT ETHILON 4 0 PS 2 18 (SUTURE) ×6 IMPLANT
SYR BULB EAR ULCER 3OZ GRN STR (SYRINGE) ×3 IMPLANT
SYR CONTROL 10ML LL (SYRINGE) ×3 IMPLANT
TOWEL GREEN STERILE FF (TOWEL DISPOSABLE) ×6 IMPLANT
UNDERPAD 30X36 HEAVY ABSORB (UNDERPADS AND DIAPERS) ×3 IMPLANT

## 2020-12-08 NOTE — Anesthesia Preprocedure Evaluation (Addendum)
Anesthesia Evaluation  Patient identified by MRN, date of birth, ID band Patient awake    Reviewed: Allergy & Precautions, NPO status , Patient's Chart, lab work & pertinent test results  History of Anesthesia Complications Negative for: history of anesthetic complications  Airway Mallampati: II  TM Distance: >3 FB Neck ROM: Full    Dental  (+) Missing,    Pulmonary neg pulmonary ROS,    Pulmonary exam normal        Cardiovascular hypertension, Pt. on medications Normal cardiovascular exam     Neuro/Psych    GI/Hepatic Neg liver ROS, GERD  Medicated and Controlled,  Endo/Other  negative endocrine ROS  Renal/GU Renal InsufficiencyRenal disease  negative genitourinary   Musculoskeletal  (+) Arthritis ,   Abdominal   Peds  Hematology Polycythemia vera   Anesthesia Other Findings Day of surgery medications reviewed with patient.  Reproductive/Obstetrics negative OB ROS                            Anesthesia Physical Anesthesia Plan  ASA: 2  Anesthesia Plan: Bier Block and Bier Block-LIDOCAINE ONLY   Post-op Pain Management: Tylenol PO (pre-op)   Induction:   PONV Risk Score and Plan: 1 and Treatment may vary due to age or medical condition, Propofol infusion and Ondansetron  Airway Management Planned: Natural Airway and Simple Face Mask  Additional Equipment: None  Intra-op Plan:   Post-operative Plan:   Informed Consent: I have reviewed the patients History and Physical, chart, labs and discussed the procedure including the risks, benefits and alternatives for the proposed anesthesia with the patient or authorized representative who has indicated his/her understanding and acceptance.       Plan Discussed with: CRNA  Anesthesia Plan Comments:        Anesthesia Quick Evaluation

## 2020-12-08 NOTE — Transfer of Care (Signed)
Immediate Anesthesia Transfer of Care Note  Patient: Steve Miller  Procedure(s) Performed: RIGHT CARPAL TUNNEL RELEASE (Right: Hand) EXCISION RIGHT LONG FINGER ANNULAR LIGAMENT CYST (Right: Finger) RELEASE TRIGGER FINGER/A-1 PULLEY RIGHT SMALL FINGER (Right: Finger)  Patient Location: PACU  Anesthesia Type:MAC and Bier block  Level of Consciousness: awake, alert  and oriented  Airway & Oxygen Therapy: Patient Spontanous Breathing and Patient connected to face mask oxygen  Post-op Assessment: Report given to RN and Post -op Vital signs reviewed and stable  Post vital signs: Reviewed and stable  Last Vitals:  Vitals Value Taken Time  BP 127/103 12/08/20 1556  Temp    Pulse 53 12/08/20 1558  Resp    SpO2 97 % 12/08/20 1558  Vitals shown include unvalidated device data.  Last Pain:  Vitals:   12/08/20 1221  TempSrc: Oral  PainSc: 0-No pain      Patients Stated Pain Goal: 3 (27/06/23 7628)  Complications: No notable events documented.

## 2020-12-08 NOTE — H&P (Signed)
Steve Miller is an 80 y.o. male.   Chief Complaint: carpal tunnel syndrome, trigger digit, cyst HPI: 80 yo male with numbness and tingling right hand with right small finger trigger digit and right long finger annular ligament cyst.  Positive nerve conduction studies.  Nocturnal symptoms.  He wishes to have right carpal tunnel release, small finger trigger release, long finger excision of annular ligament cyst with trigger release.  Allergies:  Allergies  Allergen Reactions   Metoclopramide Other (See Comments)   Nifedipine Nausea And Vomiting    Past Medical History:  Diagnosis Date   GERD (gastroesophageal reflux disease)    Hypertension    Peripheral neuropathy    Polycythemia vera ([place])     Past Surgical History:  Procedure Laterality Date   BACK SURGERY     CARPAL TUNNEL RELEASE Left 01/23/2019   Procedure: LEFT CARPAL TUNNEL RELEASE;  Surgeon: Leanora Cover, MD;  Location: [place];  Service: Orthopedics;  Laterality: Left;   JOINT REPLACEMENT Right    knee   TRIGGER FINGER RELEASE Left 01/23/2019   Procedure: LEFT LONG AND TRIGGER RELEASE TRIGGER FINGER/A-1 PULLEY;  Surgeon: Leanora Cover, MD;  Location: [place];  Service: Orthopedics;  Laterality: Left;   ULNAR NERVE TRANSPOSITION Left 01/23/2019   Procedure: LEFT ULNAR NERVE DECOMPRESSION;  Surgeon: Leanora Cover, MD;  Location: [place];  Service: Orthopedics;  Laterality: Left;    Family History: History reviewed. No pertinent family history.  Social History:   reports that he has never smoked. He has never used smokeless tobacco. He reports that he does not drink alcohol and does not use drugs.  Medications: Medications Prior to Admission  Medication Sig Dispense Refill   Ascorbic Acid (VITAMIN C PO) Take 1 tablet by mouth daily.     cholecalciferol (VITAMIN D) 1000 UNITS tablet Take 1,000 Units by mouth daily.     gabapentin (NEURONTIN) 300 MG capsule Take 300 mg by  mouth 2 (two) times daily.     hydrochlorothiazide (HYDRODIURIL) 25 MG tablet Take 25 mg by mouth daily.     Melatonin 3 MG TABS Take 3 mg by mouth at bedtime as needed.     Multiple Vitamin (MULTIVITAMIN WITH MINERALS) TABS Take 1 tablet by mouth daily.     omeprazole (PRILOSEC) 40 MG capsule Take 40 mg by mouth daily.     ruxolitinib phosphate (JAKAFI) 10 MG tablet Take 15 mg by mouth at bedtime.      ruxolitinib phosphate (JAKAFI) 20 MG tablet Take 15 mg by mouth daily.      tamsulosin (FLOMAX) 0.4 MG CAPS capsule Take 0.4 mg by mouth daily after supper.     zolpidem (AMBIEN) 10 MG tablet Take 10 mg by mouth at bedtime as needed for sleep.     diclofenac Sodium (VOLTAREN) 1 % GEL Apply 2 g topically 4 (four) times daily. Rub into affected area of foot 2 to 4 times daily 100 g 2   docusate sodium (COLACE) 100 MG capsule Take 200 mg by mouth 2 (two) times daily.     HYDROcodone-acetaminophen (NORCO) 5-325 MG tablet 1-2 tabs po q6 hours prn pain 20 tablet 0   metoCLOPramide (REGLAN) 10 MG tablet Take 1 tablet (10 mg total) by mouth every 6 (six) hours. 30 tablet 0   pantoprazole (PROTONIX) 40 MG tablet Take 1 tablet (40 mg total) by mouth daily. 30 tablet 0    No results found for this or any previous visit (  from the past 48 hour(s)).  No results found.    Blood pressure 122/62, pulse (!) 51, temperature 97.6 F (36.4 C), temperature source Oral, resp. rate 16, height 5\' 8"  (1.727 m), weight 80.7 kg, SpO2 100 %.  General appearance: alert, cooperative, and appears stated age Head: Normocephalic, without obvious abnormality, atraumatic Neck: supple, symmetrical, trachea midline Extremities: Intact sensation and capillary refill all digits.  +epl/fpl/io.  No wounds.  Pulses: 2+ and symmetric Skin: Skin color, texture, turgor normal. No rashes or lesions Neurologic: Grossly normal Incision/Wound: none  Assessment/Plan Right carpal tunnel syndrome, small finger trigger digit, long  finger annular ligament cyst.  Non operative and operative treatment options have been discussed with the patient and patient wishes to proceed with operative treatment. Risks, benefits, and alternatives of surgery have been discussed and the patient agrees with the plan of care.   Leanora Cover 12/08/2020, 1:43 PM

## 2020-12-08 NOTE — Anesthesia Procedure Notes (Signed)
Anesthesia Regional Block: Bier block (IV Regional)   Pre-Anesthetic Checklist: , timeout performed,  Correct Patient, Correct Site, Correct Laterality,  Correct Procedure, Correct Position, site marked,  Risks and benefits discussed,  Surgical consent,  Pre-op evaluation,  At surgeon's request and post-op pain management  Laterality: Right  Prep: alcohol swabs        Procedures:,,,,, intact distal pulses, single tourniquet utilized,  #20gu IV placed    Narrative:   Performed by: Personally  CRNA: Verita Lamb, CRNA

## 2020-12-08 NOTE — Op Note (Signed)
12/08/2020 [facility]                              OPERATIVE REPORT   PREOPERATIVE DIAGNOSIS:   1. Right carpal tunnel syndrome. 2. Right long finger annular ligament cyst 3. Right small finger trigger digit  POSTOPERATIVE DIAGNOSIS:   1. Right carpal tunnel syndrome. 2. Right long finger annular ligament cyst 3. Right small finger trigger digit  PROCEDURE:   1. Right carpal tunnel release 2. Right long finger annular ligament cyst excision and A1 pulley release 3. Right small finger trigger digit release  SURGEON:  Leanora Cover, MD  ASSISTANT:  none.  ANESTHESIA: Bier block with sedation  IV FLUIDS:  Per anesthesia flow sheet.  ESTIMATED BLOOD LOSS:  Minimal.  COMPLICATIONS:  None.  SPECIMENS:  None.  TOURNIQUET TIME:    Total Tourniquet Time Documented: Forearm (Right) - 50 minutes Total: Forearm (Right) - 50 minutes   DISPOSITION:  Stable to PACU.  LOCATION: [facility]  INDICATIONS:  80 yo male with numbness and tingling right hand.  Positive nerve conduction studies.  Nocturnal symptoms.  Also with annular ligament cyst right long finger and trigger digit right small finger.  He wishes to have a carpal tunnel release for management of his symptoms.  Risks, benefits and alternatives of surgery were discussed including the risk of blood loss; infection; damage to nerves, vessels, tendons, ligaments, bone; failure of surgery; need for additional surgery; complications with wound healing; continued pain; recurrence of carpal tunnel syndrome; and damage to motor branch. He voiced understanding of these risks and elected to proceed.   OPERATIVE COURSE:  After being identified preoperatively by myself, the patient and I agreed upon the procedure and site of procedure.  The surgical site was marked.  Surgical consent had been signed.  He was given IV Ancef as preoperative antibiotic prophylaxis.  He was transferred to the operating room and  placed on the operating room table in supine position with the right upper extremity on an armboard.  Bier block anesthesia was induced by the anesthesiologist.  Right upper extremity was prepped and draped in normal sterile orthopaedic fashion.  A surgical pause was performed between the surgeons, anesthesia, and operating room staff, and all were in agreement as to the patient, procedure, and site of procedure.  Tourniquet at the proximal aspect of the forearm had been inflated for the Bier block.   An incision was made at the volar aspect of the MP joint of the little finger.  This was carried into the subcutaneous tissues by spreading technique.  Bipolar electrocautery was used to obtain hemostasis.  The radial and ulnar digital nerves were protected throughout the case. The flexor sheath was identified.  The A1 pulley was identified and sharply incised.  It was released in its entirety.  The proximal 1-2 mm of the A2 pulley was vented to allow better excursion of the tendons.  The finger was placed through a range of motion and there was noted to be no catching.  The tendons were brought through the wound and any adherences released.  The wound was then copiously irrigated with sterile saline. It was closed with 4-0 nylon in a horizontal mattress fashion. An incision was made at the volar aspect of the MP joint of the long finger in a Brunner fashion.  This was carried into the subcutaneous tissues by spreading technique.  Bipolar electrocautery was  used to obtain hemostasis.  The radial and ulnar digital nerves were protected throughout the case. The flexor sheath was identified.  There was a cyst from the radial side of the A1 pulley.  This was carefully freed from surrounding tissues.  The A1 pulley was identified and sharply incised.  It was released in its entirety.  The cyst was removed with a portion of the A1 pulley.  The radial digital nerve and artery were visualized and were intact.  The finger was  placed through a range of motion and there was noted to be no catching.  The tendons were brought through the wound and any adherences released.  The wound was then copiously irrigated with sterile saline. It was closed with 4-0 nylon in a horizontal mattress fashion.  An incision was made over the transverse carpal ligament and carried into the subcutaneous tissues by spreading technique.  Bipolar electrocautery was used to obtain hemostasis.  The palmar fascia was sharply incised.  The transverse carpal ligament was identified and sharply incised.  It was incised distally first.  The flexor tendons were identified.  The flexor tendon to the ring finger was identified and retracted radially.  The transverse carpal ligament was then incised proximally.  Scissors were used to split the distal aspect of the volar antebrachial fascia.  A finger was placed into the wound to ensure complete decompression, which was the case.  The nerve was examined.  It was flattened and hyperemic.  The motor branch was identified and was intact.  The wound was copiously irrigated with sterile saline.  It was then closed with 4-0 nylon in a horizontal mattress fashion.  It was injected with 0.25% plain Marcaine to aid in postoperative analgesia.  It was dressed with sterile Xeroform, 4x4s, an ABD, and wrapped with Kerlix and an Ace bandage.  Tourniquet was deflated at 50 minutes.  Fingertips were pink with brisk capillary refill after deflation of the tourniquet.  Operative drapes were broken down.  The patient was awoken from anesthesia safely.  He was transferred back to stretcher and taken to the PACU in stable condition.  I will see him back in the office in 1 week for postoperative followup.  I will give him a prescription for Norco 5/325 1 tabs PO q6 hours prn pain, dispense #15.    Leanora Cover, MD Electronically signed, 12/08/20

## 2020-12-08 NOTE — Discharge Instructions (Addendum)
[facility] Instructions Hand Surgery  Wound Care: Keep your hand elevated above the level of your heart.  Do not allow it to dangle by your side.  Keep the dressing dry and do not remove it unless your doctor advises you to do so.  He will usually change it at the time of your post-op visit.  Moving your fingers is advised to stimulate circulation but will depend on the site of your surgery.  If you have a splint applied, your doctor will advise you regarding movement.  Activity: Do not drive or operate machinery today.  Rest today and then you may return to your normal activity and work as indicated by your physician.  Diet:  Drink liquids today or eat a light diet.  You may resume a regular diet tomorrow.    General expectations: Pain for two to three days. Fingers may become slightly swollen.  Call your doctor if any of the following occur: Severe pain not relieved by pain medication. Elevated temperature. Dressing soaked with blood. Inability to move fingers. White or bluish color to fingers.  Post Anesthesia Home Care Instructions  Activity: Get plenty of rest for the remainder of the day. A responsible individual must stay with you for 24 hours following the procedure.  For the next 24 hours, DO NOT: -Drive a car -Paediatric nurse -Drink alcoholic beverages -Take any medication unless instructed by your physician -Make any legal decisions or sign important papers.  Meals: Start with liquid foods such as gelatin or soup. Progress to regular foods as tolerated. Avoid greasy, spicy, heavy foods. If nausea and/or vomiting occur, drink only clear liquids until the nausea and/or vomiting subsides. Call your physician if vomiting continues.  Special Instructions/Symptoms: Your throat may feel dry or sore from the anesthesia or the breathing tube placed in your throat during surgery. If this causes discomfort, gargle with warm salt water. The discomfort should disappear within  24 hours.  If you had a scopolamine patch placed behind your ear for the management of post- operative nausea and/or vomiting:  1. The medication in the patch is effective for 72 hours, after which it should be removed.  Wrap patch in a tissue and discard in the trash. Wash hands thoroughly with soap and water. 2. You may remove the patch earlier than 72 hours if you experience unpleasant side effects which may include dry mouth, dizziness or visual disturbances. 3. Avoid touching the patch. Wash your hands with soap and water after contact with the patch.    TYLENOL 1000 mg given at 1212

## 2020-12-08 NOTE — Anesthesia Postprocedure Evaluation (Signed)
Anesthesia Post Note  Patient: Steve Miller  Procedure(s) Performed: RIGHT CARPAL TUNNEL RELEASE (Right: Hand) EXCISION RIGHT LONG FINGER ANNULAR LIGAMENT CYST (Right: Finger) RELEASE TRIGGER FINGER/A-1 PULLEY RIGHT SMALL FINGER (Right: Finger)     Patient location during evaluation: PACU Anesthesia Type: Bier Block Level of consciousness: awake and alert and oriented Pain management: pain level controlled Vital Signs Assessment: post-procedure vital signs reviewed and stable Respiratory status: spontaneous breathing, nonlabored ventilation and respiratory function stable Cardiovascular status: blood pressure returned to baseline Postop Assessment: no apparent nausea or vomiting Anesthetic complications: no   No notable events documented.  Last Vitals:  Vitals:   12/08/20 1221 12/08/20 1600  BP: 122/62 106/69  Pulse: (!) 51   Resp: 16 14  Temp: 36.4 C   SpO2: 100%     Last Pain:  Vitals:   12/08/20 1221  TempSrc: Oral  PainSc: 0-No pain                 Marthenia Rolling

## 2020-12-10 LAB — SURGICAL PATHOLOGY

## 2020-12-11 ENCOUNTER — Encounter (HOSPITAL_BASED_OUTPATIENT_CLINIC_OR_DEPARTMENT_OTHER): Payer: Self-pay | Admitting: Orthopedic Surgery

## 2021-02-22 ENCOUNTER — Encounter (HOSPITAL_COMMUNITY): Payer: Self-pay | Admitting: Emergency Medicine

## 2021-02-22 ENCOUNTER — Other Ambulatory Visit: Payer: Self-pay

## 2021-02-22 ENCOUNTER — Emergency Department (HOSPITAL_COMMUNITY)
Admission: EM | Admit: 2021-02-22 | Discharge: 2021-02-23 | Disposition: A | Discharge status: Home / Self Care | Payer: No Typology Code available for payment source | Attending: Emergency Medicine | Admitting: Emergency Medicine

## 2021-02-22 DIAGNOSIS — M545 Low back pain, unspecified: Secondary | ICD-10-CM | POA: Insufficient documentation

## 2021-02-22 MED ORDER — HYDROMORPHONE HCL 1 MG/ML IJ SOLN
1.0000 mg | Freq: Once | INTRAMUSCULAR | Status: AC
  Administered 2021-02-23: 1 mg via INTRAVENOUS
  Filled 2021-02-22: qty 1

## 2021-02-22 MED ORDER — ONDANSETRON HCL 4 MG/2ML IJ SOLN
4.0000 mg | Freq: Once | INTRAMUSCULAR | Status: AC
  Administered 2021-02-23: 4 mg via INTRAVENOUS
  Filled 2021-02-22: qty 2

## 2021-02-22 NOTE — ED Triage Notes (Signed)
Pt arrived via EMS from home. Pt has low back pain through both sides of his back which began at 1pm. Pt states he usually walks but has been unable to walk today due to the pain. Pt denies incontinence.

## 2021-02-22 NOTE — ED Provider Notes (Signed)
[facility] DEPT Provider Note   CSN: [id number] Arrival date & time: 02/22/21  2339     History  Chief Complaint  Patient presents with   Back Pain    Steve Miller is a 82 y.o. male.  Patient presents to the emergency department for evaluation of low back pain.  Patient reports that pain has been present since around 1 PM.  Patient reports the pain is progressively worsened.  He has not had any injury.  Pain does not radiate to the legs but becomes much more severe if he tries to bear weight on the right leg.  No numbness, weakness of extremities.  No change in bowel or bladder function.      Home Medications Prior to Admission medications   Medication Sig Start Date End Date Taking? Authorizing Provider  methylPREDNISolone (MEDROL DOSEPAK) 4 MG TBPK tablet As directed 02/23/21  Yes Nuala Chiles, Gwenyth Allegra, MD  oxyCODONE-acetaminophen (PERCOCET) 5-325 MG tablet Take 1-2 tablets by mouth every 4 (four) hours as needed. 02/23/21  Yes Glynnis Gavel, Gwenyth Allegra, MD  Ascorbic Acid (VITAMIN C PO) Take 1 tablet by mouth daily.    [provider]  cholecalciferol (VITAMIN D) 1000 UNITS tablet Take 1,000 Units by mouth daily.    [provider]  diclofenac Sodium (VOLTAREN) 1 % GEL Apply 2 g topically 4 (four) times daily. Rub into affected area of foot 2 to 4 times daily 06/13/19   Trula Slade, DPM  docusate sodium (COLACE) 100 MG capsule Take 200 mg by mouth 2 (two) times daily.    [provider]  gabapentin (NEURONTIN) 300 MG capsule Take 300 mg by mouth 2 (two) times daily.    [provider]  hydrochlorothiazide (HYDRODIURIL) 25 MG tablet Take 25 mg by mouth daily.    [provider]  HYDROcodone-acetaminophen (NORCO/VICODIN) 5-325 MG tablet 1-2 tabs PO q6 hours prn pain 12/08/20   Leanora Cover, MD  Melatonin 3 MG TABS Take 3 mg by mouth at bedtime as needed.    [provider]  metoCLOPramide (REGLAN)  10 MG tablet Take 1 tablet (10 mg total) by mouth every 6 (six) hours. 05/23/16   Isla Pence, MD  Multiple Vitamin (MULTIVITAMIN WITH MINERALS) TABS Take 1 tablet by mouth daily.    [provider]  omeprazole (PRILOSEC) 40 MG capsule Take 40 mg by mouth daily.    [provider]  pantoprazole (PROTONIX) 40 MG tablet Take 1 tablet (40 mg total) by mouth daily. 07/30/11 07/29/12  Virgel Manifold, MD  ruxolitinib phosphate (JAKAFI) 10 MG tablet Take 15 mg by mouth at bedtime.     [provider]  ruxolitinib phosphate (JAKAFI) 20 MG tablet Take 15 mg by mouth daily.     [provider]  tamsulosin (FLOMAX) 0.4 MG CAPS capsule Take 0.4 mg by mouth daily after supper.    [provider]  zolpidem (AMBIEN) 10 MG tablet Take 10 mg by mouth at bedtime as needed for sleep.    [provider]      Allergies    Metoclopramide and Nifedipine    Review of Systems   Review of Systems  Musculoskeletal:  Positive for back pain.   Physical Exam Updated Vital Signs BP 137/75    Pulse 71    Temp (!) 97.3 F (36.3 C) (Oral)    Resp 13    Ht 5\' 8"  (1.727 m)    Wt 77.1 kg    SpO2 95%  BMI 25.85 kg/m  Physical Exam Vitals and nursing note reviewed.  Constitutional:      General: He is not in acute distress.    Appearance: He is well-developed.  HENT:     Head: Normocephalic and atraumatic.     Mouth/Throat:     Mouth: Mucous membranes are moist.  Eyes:     General: Vision grossly intact. Gaze aligned appropriately.     Extraocular Movements: Extraocular movements intact.     Conjunctiva/sclera: Conjunctivae normal.  Cardiovascular:     Rate and Rhythm: Normal rate and regular rhythm.     Pulses: Normal pulses.     Heart sounds: Normal heart sounds, S1 normal and S2 normal. No murmur heard.   No friction rub. No gallop.  Pulmonary:     Effort: Pulmonary effort is normal. No respiratory distress.     Breath sounds: Normal breath sounds.   Abdominal:     Palpations: Abdomen is soft.     Tenderness: There is no abdominal tenderness. There is no guarding or rebound.     Hernia: No hernia is present.  Musculoskeletal:        General: No swelling.     Cervical back: Full passive range of motion without pain, normal range of motion and neck supple. No pain with movement, spinous process tenderness or muscular tenderness. Normal range of motion.     Lumbar back: Tenderness present. Negative right straight leg raise test and negative left straight leg raise test.     Right lower leg: No edema.     Left lower leg: No edema.  Skin:    General: Skin is warm and dry.     Capillary Refill: Capillary refill takes less than 2 seconds.     Findings: No ecchymosis, erythema, lesion or wound.  Neurological:     Mental Status: He is alert and oriented to person, place, and time.     GCS: GCS eye subscore is 4. GCS verbal subscore is 5. GCS motor subscore is 6.     Cranial Nerves: Cranial nerves 2-12 are intact.     Sensory: Sensation is intact.     Motor: Motor function is intact. No weakness or abnormal muscle tone.     Coordination: Coordination is intact.  Psychiatric:        Mood and Affect: Mood normal.        Speech: Speech normal.        Behavior: Behavior normal.    ED Results / Procedures / Treatments   Labs (all labs ordered are listed, but only abnormal results are displayed) Labs Reviewed  CBC WITH DIFFERENTIAL/PLATELET - Abnormal; Notable for the following components:      Result Value   RDW 18.7 (*)    Lymphs Abs 0.4 (*)    All other components within normal limits  BASIC METABOLIC PANEL - Abnormal; Notable for the following components:   Potassium 3.2 (*)    Glucose, Bld 116 (*)    Creatinine, Ser 1.32 (*)    GFR, Estimated 55 (*)    All other components within normal limits    EKG None  Radiology CT Lumbar Spine Wo Contrast  Result Date: 02/23/2021 CLINICAL DATA:  Initial evaluation for acute low back  pain, increased fracture risk. EXAM: CT LUMBAR SPINE WITHOUT CONTRAST TECHNIQUE: Multidetector CT imaging of the lumbar spine was performed without intravenous contrast administration. Multiplanar CT image reconstructions were also generated. RADIATION DOSE REDUCTION: This exam was performed according to the  departmental dose-optimization program which includes automated exposure control, adjustment of the mA and/or kV according to patient size and/or use of iterative reconstruction technique. COMPARISON:  CT from 04/12/2015. FINDINGS: Segmentation: Standard. Lowest well-formed disc space labeled the L5-S1 level. Alignment: Sigmoid scoliosis. 3 mm facet mediated anterolisthesis of L3 on L4. 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 Minimal height loss at the superior endplate of L2 is also unchanged. No acute fracture. Visualized sacrum and pelvis intact. SI joints symmetric and within normal limits. No discrete or worrisome osseous lesions. Paraspinal and other soft tissues: Paraspinous soft tissues demonstrate no acute finding. Aortic atherosclerosis. Probable cholelithiasis partially visualized. Disc levels: L1-2: Minimal annular disc bulge with mild facet hypertrophy. No spinal stenosis. Foramina remain patent. L2-3: Advanced degenerative intervertebral disc space narrowing with diffuse disc bulge and disc desiccation. Reactive endplate spurring. Left foraminal disc protrusion with associated calcification (series 4, image 54). Mild facet and ligament flavum hypertrophy. Resultant mild-to-moderate canal with bilateral subarticular stenosis. Moderate left worse than right L2 foraminal narrowing. L3-4: Advanced degenerative intervertebral disc space narrowing with diffuse disc bulge and disc desiccation. Disc bulging eccentric to the right with associated reactive endplate spurring. Moderate bilateral facet hypertrophy. Resultant severe canal with bilateral  subarticular stenosis. Moderate right worse than left L3 foraminal narrowing. L4-5: Advanced degenerative intervertebral disc space narrowing with disc desiccation and diffuse disc bulge. Reactive endplate spurring. Superimposed broad-based right extraforaminal disc protrusion closely approximates the exiting right L4 nerve root (series 4, image 82). Prior right hemi laminectomy. Mild to moderate facet hypertrophy. No significant residual spinal stenosis. Moderate right worse than left L4 foraminal narrowing. L5-S1: Advanced degenerative intervertebral disc space narrowing with disc desiccation and diffuse disc bulge. Reactive endplate spurring. Moderate bilateral facet hypertrophy. No significant spinal stenosis. Moderate right with severe left L5 foraminal stenosis. IMPRESSION: 1. No acute fracture or other osseous abnormality within the lumbar spine. 2. 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 3. 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 4. Moderate bilateral L2 through L5 foraminal stenosis, most severe at L5 on the left. 5. Cholelithiasis. 6. Aortic Atherosclerosis (ICD10-I70.0). Electronically Signed   By: Jeannine Boga M.D.   On: 02/23/2021 01:25    Procedures Procedures    Medications Ordered in ED Medications  HYDROmorphone (DILAUDID) injection 1 mg (1 mg Intravenous Given 02/23/21 0007)  ondansetron (ZOFRAN) injection 4 mg (4 mg Intravenous Given 02/23/21 0007)    ED Course/ Medical Decision Making/ A&P                           Medical Decision Making Amount and/or Complexity of Data Reviewed Labs: ordered. Radiology: ordered.  Risk Prescription drug management.   81 year old man with prior history of spinal stenosis presents to the emergency department with back pain that started earlier today.  Pain is  across the lower back at the waistline.  He denies injury.  Pain does not radiate.  He has normal strength, sensation in the lower extremities.  No change in bowel or bladder function.  No saddle anesthesia or foot drop.  No concern for cauda equina syndrome.  Patient has a reassuring neurologic exam.  He is feeling better after analgesia.  CT scan shows progressive spinal stenosis but no other acute findings.  He has been seen at [facility] in the past.  He reports that they did injections in his back that helped.  We will refer back to neurosurgery.        Final Clinical Impression(s) / ED Diagnoses Final diagnoses:  Acute bilateral low back pain without sciatica    Rx / DC Orders ED Discharge Orders          Ordered    oxyCODONE-acetaminophen (PERCOCET) 5-325 MG tablet  Every 4 hours PRN        02/23/21 0320    methylPREDNISolone (MEDROL DOSEPAK) 4 MG TBPK tablet        02/23/21 0320              Orpah Greek, MD 02/23/21 [phone]

## 2021-02-23 ENCOUNTER — Emergency Department (HOSPITAL_COMMUNITY): Payer: No Typology Code available for payment source

## 2021-02-23 LAB — CBC WITH DIFFERENTIAL/PLATELET
Abs Immature Granulocytes: 0.06 10*3/uL (ref 0.00–0.07)
Basophils Absolute: 0.1 10*3/uL (ref 0.0–0.1)
Basophils Relative: 1 %
Eosinophils Absolute: 0.1 10*3/uL (ref 0.0–0.5)
Eosinophils Relative: 1 %
HCT: 41.8 % (ref 39.0–52.0)
Hemoglobin: 14.2 g/dL (ref 13.0–17.0)
Immature Granulocytes: 1 %
Lymphocytes Relative: 5 %
Lymphs Abs: 0.4 10*3/uL — ABNORMAL LOW (ref 0.7–4.0)
MCH: 27.4 pg (ref 26.0–34.0)
MCHC: 34 g/dL (ref 30.0–36.0)
MCV: 80.7 fL (ref 80.0–100.0)
Monocytes Absolute: 0.5 10*3/uL (ref 0.1–1.0)
Monocytes Relative: 6 %
Neutro Abs: 7.6 10*3/uL (ref 1.7–7.7)
Neutrophils Relative %: 86 %
Platelets: 329 10*3/uL (ref 150–400)
RBC: 5.18 MIL/uL (ref 4.22–5.81)
RDW: 18.7 % — ABNORMAL HIGH (ref 11.5–15.5)
WBC: 8.7 10*3/uL (ref 4.0–10.5)
nRBC: 0 % (ref 0.0–0.2)

## 2021-02-23 LAB — BASIC METABOLIC PANEL
Anion gap: 11 (ref 5–15)
BUN: 17 mg/dL (ref 8–23)
CO2: 24 mmol/L (ref 22–32)
Calcium: 9 mg/dL (ref 8.9–10.3)
Chloride: 102 mmol/L (ref 98–111)
Creatinine, Ser: 1.32 mg/dL — ABNORMAL HIGH (ref 0.61–1.24)
GFR, Estimated: 55 mL/min — ABNORMAL LOW (ref >60)
Glucose, Bld: 116 mg/dL — ABNORMAL HIGH (ref 70–99)
Potassium: 3.2 mmol/L — ABNORMAL LOW (ref 3.5–5.1)
Sodium: 137 mmol/L (ref 135–145)

## 2021-02-23 MED ORDER — METHYLPREDNISOLONE 4 MG PO TBPK: ORAL_TABLET | ORAL | 0 refills | Status: DC

## 2021-02-23 MED ORDER — OXYCODONE-ACETAMINOPHEN 5-325 MG PO TABS: 1.0000 | ORAL_TABLET | ORAL | 0 refills | Status: AC | PRN

## 2021-10-02 ENCOUNTER — Ambulatory Visit (INDEPENDENT_AMBULATORY_CARE_PROVIDER_SITE_OTHER): Payer: No Typology Code available for payment source

## 2021-10-02 DIAGNOSIS — Z23 Encounter for immunization: Secondary | ICD-10-CM

## 2021-12-03 ENCOUNTER — Telehealth: Payer: Self-pay | Admitting: Family Medicine

## 2021-12-03 NOTE — Telephone Encounter (Signed)
Steve Miller is not currently one of our patients. Steve Miller wife Steve Miller) is currently one of our patients.  Steve Miller states he escorted his wife to our clinic on 10/02/21, to get her Flu Shot.  Steve Miller was offered a Flu shot, as well.  Steve Miller states he usually gets his shots from the [place], but since he was told the shot was free, he accepted the offer.  Steve Miller just received a bill in the mail for said shot, for $108.    Steve Miller called the billing dept and was told to call Dr. Elease Hashimoto.  Steve Miller was informed that MD is out of the office today.  Steve Miller is very upset and wants answers.  Please call him back as soon as possible.  [phone]

## 2021-12-04 NOTE — Telephone Encounter (Signed)
Message has been sent to [facility] for review

## 2021-12-07 NOTE — Telephone Encounter (Signed)
Noted  

## 2022-11-29 ENCOUNTER — Encounter: Payer: Self-pay | Admitting: Cardiovascular Disease

## 2022-11-29 ENCOUNTER — Ambulatory Visit
Payer: No Typology Code available for payment source | Attending: Cardiovascular Disease | Admitting: Cardiovascular Disease

## 2022-11-29 VITALS — BP 134/60 | HR 59 | Resp 95 | Ht 68.0 in | Wt 165.8 lb

## 2022-11-29 DIAGNOSIS — I441 Atrioventricular block, second degree: Secondary | ICD-10-CM | POA: Diagnosis not present

## 2022-11-29 DIAGNOSIS — I495 Sick sinus syndrome: Secondary | ICD-10-CM | POA: Diagnosis not present

## 2022-11-29 NOTE — H&P (View-Only) (Signed)
  Electrophysiology Office Note:    Date:  11/29/2022   ID:  Steve Miller, DOB 08-24-1940, MRN [id number]  PCP:  [facility]   [facility] Providers Cardiologist:  None     Referring MD: [facility]   History of Present Illness:    Steve Miller is a 82 y.o. male with a medical history significant for polycythemia vera, hypertension, peripheral neuropathy, referred for management of second and third-degree heart block.     I discussed the use of AI scribe software for clinical note transcription with the patient, who gave verbal consent to proceed.  According to referral records he had 3 episodes of head spinning and difficulty saying that occurred over the past year.  Monitor was placed which showed second-degree type II AV block and complete heart block during the daytime.     Today, he reports that he is doing well.  He does not recall ever having presyncope or syncope.    EKGs/Labs/Other Studies Reviewed Today:     Echocardiogram:  TTE September 2024 EF 61%   Monitors:  Zio monitor  - placed by [facility] 10/2022 -- my interpretation Sinus rhythm heart rate 38-114 beats minute, average 56 bpm There were episodes of nonsustained VT. There were also episodes of second-degree Mobitz 2 heart block and complete heart block occurring during daylight hours.   EKG:   EKG Interpretation Date/Time:  Monday November 29 2022 14:20:51 EST Ventricular Rate:  59 PR Interval:  160 QRS Duration:  138 QT Interval:  496 QTC Calculation: 491 R Axis:   -60  Text Interpretation: Sinus bradycardia with occasional Premature ventricular complexes Right bundle branch block Left anterior fascicular block Bifascicular block When compared with ECG of 04-Dec-2020 14:12, No significant change was found Confirmed by York Pellant [phone]) on 11/29/2022 2:33:51 PM     Physical Exam:    VS:  BP 134/60 (BP Location: Left Arm, Patient Position: Sitting, Cuff Size: Large)    Pulse (!) 59   Resp (!) 95   Ht 5\' 8"  (1.727 m)   Wt 165 lb 12.8 oz (75.2 kg)   BMI 25.21 kg/m     Wt Readings from Last 3 Encounters:  11/29/22 165 lb 12.8 oz (75.2 kg)  02/22/21 170 lb (77.1 kg)  12/08/20 177 lb 14.6 oz (80.7 kg)     GEN: Well nourished, well developed in no acute distress CARDIAC: RRR, no murmurs, rubs, gallops RESPIRATORY:  Normal work of breathing MUSCULOSKELETAL: no edema    ASSESSMENT & PLAN:     Complete heart block Baseline bifascicular block He has a good escape rhythm currently We discussed the indication and rationale for the pacemaker placement he would like to proceed.  I discussed the indication for the procedure and the logistics, risks, potential benefit, and after care. I specifically explained that risks include but are not limited to infection, bleeding,damage to blood vessels, lung, and the heart -- but risk of prolonged hospitalization, need for surgery, or the event of stroke, heart attack, or death are low but not zero.      Signed, Maurice Small, MD  11/29/2022 2:50 PM    [facility]

## 2022-11-29 NOTE — Progress Notes (Signed)
  Electrophysiology Office Note:    Date:  11/29/2022   ID:  Steve Miller, DOB 08-24-1940, MRN [id number]  PCP:  [facility]   [facility] Providers Cardiologist:  None     Referring MD: [facility]   History of Present Illness:    Steve Miller is a 82 y.o. male with a medical history significant for polycythemia vera, hypertension, peripheral neuropathy, referred for management of second and third-degree heart block.     I discussed the use of AI scribe software for clinical note transcription with the patient, who gave verbal consent to proceed.  According to referral records he had 3 episodes of head spinning and difficulty saying that occurred over the past year.  Monitor was placed which showed second-degree type II AV block and complete heart block during the daytime.     Today, he reports that he is doing well.  He does not recall ever having presyncope or syncope.    EKGs/Labs/Other Studies Reviewed Today:     Echocardiogram:  TTE September 2024 EF 61%   Monitors:  Zio monitor  - placed by [facility] 10/2022 -- my interpretation Sinus rhythm heart rate 38-114 beats minute, average 56 bpm There were episodes of nonsustained VT. There were also episodes of second-degree Mobitz 2 heart block and complete heart block occurring during daylight hours.   EKG:   EKG Interpretation Date/Time:  Monday November 29 2022 14:20:51 EST Ventricular Rate:  59 PR Interval:  160 QRS Duration:  138 QT Interval:  496 QTC Calculation: 491 R Axis:   -60  Text Interpretation: Sinus bradycardia with occasional Premature ventricular complexes Right bundle branch block Left anterior fascicular block Bifascicular block When compared with ECG of 04-Dec-2020 14:12, No significant change was found Confirmed by York Pellant [phone]) on 11/29/2022 2:33:51 PM     Physical Exam:    VS:  BP 134/60 (BP Location: Left Arm, Patient Position: Sitting, Cuff Size: Large)    Pulse (!) 59   Resp (!) 95   Ht 5\' 8"  (1.727 m)   Wt 165 lb 12.8 oz (75.2 kg)   BMI 25.21 kg/m     Wt Readings from Last 3 Encounters:  11/29/22 165 lb 12.8 oz (75.2 kg)  02/22/21 170 lb (77.1 kg)  12/08/20 177 lb 14.6 oz (80.7 kg)     GEN: Well nourished, well developed in no acute distress CARDIAC: RRR, no murmurs, rubs, gallops RESPIRATORY:  Normal work of breathing MUSCULOSKELETAL: no edema    ASSESSMENT & PLAN:     Complete heart block Baseline bifascicular block He has a good escape rhythm currently We discussed the indication and rationale for the pacemaker placement he would like to proceed.  I discussed the indication for the procedure and the logistics, risks, potential benefit, and after care. I specifically explained that risks include but are not limited to infection, bleeding,damage to blood vessels, lung, and the heart -- but risk of prolonged hospitalization, need for surgery, or the event of stroke, heart attack, or death are low but not zero.      Signed, Maurice Small, MD  11/29/2022 2:50 PM    [facility]

## 2022-11-29 NOTE — Patient Instructions (Signed)
Medication Instructions:  Your physician recommends that you continue on your current medications as directed. Please refer to the Current Medication list given to you today. *If you need a refill on your cardiac medications before your next appointment, please call your pharmacy*   Lab Work: CBC and BMET today If you have labs (blood work) drawn today and your tests are completely normal, you will receive your results only by: MyChart Message (if you have MyChart) OR A paper copy in the mail If you have any lab test that is abnormal or we need to change your treatment, we will call you to review the results.   Testing/Procedures: Dual Chamber Pacemaker - see instruction letter Your physician has recommended that you have a pacemaker inserted. A pacemaker is a small device that is placed under the skin of your chest or abdomen to help control abnormal heart rhythms. This device uses electrical pulses to prompt the heart to beat at a normal rate. Pacemakers are used to treat heart rhythms that are too slow. Wire (leads) are attached to the pacemaker that goes into the chambers of you heart. This is done in the hospital and usually requires and overnight stay. Please see the instruction sheet given to you today for more information.   Follow-Up: At [facility], you and your health needs are our priority.  As part of our continuing mission to provide you with exceptional heart care, we have created designated Provider Care Teams.  These Care Teams include your primary Cardiologist (physician) and Advanced Practice Providers (APPs -  Physician Assistants and Nurse Practitioners) who all work together to provide you with the care you need, when you need it.  We recommend signing up for the patient portal called "MyChart".  Sign up information is provided on this After Visit Summary.  MyChart is used to connect with patients for Virtual Visits (Telemedicine).  Patients are able to view  lab/test results, encounter notes, upcoming appointments, etc.  Non-urgent messages can be sent to your provider as well.   To learn more about what you can do with MyChart, go to [email or web address].    Your next appointment:   We will schedule follow up after your pacemaker implant  Provider:   York Pellant, MD

## 2022-11-30 LAB — CBC
Hematocrit: 36.1 % — ABNORMAL LOW (ref 37.5–51.0)
Hemoglobin: 12.1 g/dL — ABNORMAL LOW (ref 13.0–17.7)
MCH: 28.3 pg (ref 26.6–33.0)
MCHC: 33.5 g/dL (ref 31.5–35.7)
MCV: 85 fL (ref 79–97)
Platelets: 437 10*3/uL (ref 150–450)
RBC: 4.27 x10E6/uL (ref 4.14–5.80)
RDW: 21.3 % — ABNORMAL HIGH (ref 11.6–15.4)
WBC: 8.3 10*3/uL (ref 3.4–10.8)

## 2022-11-30 LAB — BASIC METABOLIC PANEL
BUN/Creatinine Ratio: 8 — ABNORMAL LOW (ref 10–24)
BUN: 10 mg/dL (ref 8–27)
CO2: 23 mmol/L (ref 20–29)
Calcium: 9.3 mg/dL (ref 8.6–10.2)
Chloride: 104 mmol/L (ref 96–106)
Creatinine, Ser: 1.26 mg/dL (ref 0.76–1.27)
Glucose: 83 mg/dL (ref 70–99)
Potassium: 4.6 mmol/L (ref 3.5–5.2)
Sodium: 142 mmol/L (ref 134–144)
eGFR: 58 mL/min/{1.73_m2} — ABNORMAL LOW (ref >59)

## 2022-12-10 NOTE — Pre-Procedure Instructions (Signed)
Instructed patient on the following items: Arrival time 1000 Nothing to eat or drink after midnight No meds AM of procedure Responsible person to drive you home and stay with you for 24 hrs Wash with special soap night before and morning of procedure  

## 2022-12-13 ENCOUNTER — Encounter (HOSPITAL_COMMUNITY)
Admission: RE | Disposition: A | Discharge status: Home / Self Care | Payer: Self-pay | Source: Home / Self Care | Attending: Cardiovascular Disease

## 2022-12-13 ENCOUNTER — Other Ambulatory Visit: Payer: Self-pay

## 2022-12-13 ENCOUNTER — Ambulatory Visit (HOSPITAL_COMMUNITY)
Admission: RE | Admit: 2022-12-13 | Discharge: 2022-12-13 | Disposition: A | Discharge status: Home / Self Care | Payer: No Typology Code available for payment source | Attending: Cardiovascular Disease | Admitting: Cardiovascular Disease

## 2022-12-13 ENCOUNTER — Ambulatory Visit (HOSPITAL_COMMUNITY): Payer: No Typology Code available for payment source

## 2022-12-13 DIAGNOSIS — I452 Bifascicular block: Secondary | ICD-10-CM | POA: Diagnosis not present

## 2022-12-13 DIAGNOSIS — I442 Atrioventricular block, complete: Secondary | ICD-10-CM | POA: Diagnosis not present

## 2022-12-13 DIAGNOSIS — G629 Polyneuropathy, unspecified: Secondary | ICD-10-CM | POA: Diagnosis not present

## 2022-12-13 DIAGNOSIS — R001 Bradycardia, unspecified: Secondary | ICD-10-CM | POA: Diagnosis not present

## 2022-12-13 DIAGNOSIS — D45 Polycythemia vera: Secondary | ICD-10-CM | POA: Diagnosis not present

## 2022-12-13 DIAGNOSIS — I10 Essential (primary) hypertension: Secondary | ICD-10-CM | POA: Diagnosis not present

## 2022-12-13 HISTORY — PX: PACEMAKER IMPLANT: EP1218

## 2022-12-13 SURGERY — PACEMAKER IMPLANT

## 2022-12-13 MED ORDER — ACETAMINOPHEN 325 MG PO TABS
325.0000 mg | ORAL_TABLET | ORAL | Status: DC | PRN
  Administered 2022-12-13: 650 mg via ORAL
  Filled 2022-12-13: qty 2

## 2022-12-13 MED ORDER — FENTANYL CITRATE (PF) 100 MCG/2ML IJ SOLN
INTRAMUSCULAR | Status: AC
  Filled 2022-12-13: qty 2

## 2022-12-13 MED ORDER — FENTANYL CITRATE (PF) 100 MCG/2ML IJ SOLN
INTRAMUSCULAR | Status: DC | PRN
  Administered 2022-12-13: 25 ug via INTRAVENOUS

## 2022-12-13 MED ORDER — POVIDONE-IODINE 10 % EX SWAB
2.0000 | Freq: Once | CUTANEOUS | Status: AC
  Administered 2022-12-13: 2 via TOPICAL

## 2022-12-13 MED ORDER — CHLORHEXIDINE GLUCONATE 4 % EX SOLN: 4.0000 | Freq: Once | CUTANEOUS | Status: DC

## 2022-12-13 MED ORDER — CEFAZOLIN SODIUM-DEXTROSE 2-4 GM/100ML-% IV SOLN
INTRAVENOUS | Status: AC
  Administered 2022-12-13: 2 g via INTRAVENOUS
  Filled 2022-12-13: qty 100

## 2022-12-13 MED ORDER — MIDAZOLAM HCL 5 MG/5ML IJ SOLN
INTRAMUSCULAR | Status: AC
  Filled 2022-12-13: qty 5

## 2022-12-13 MED ORDER — SODIUM CHLORIDE 0.9 % IV SOLN: 80.0000 mg | INTRAVENOUS | Status: AC

## 2022-12-13 MED ORDER — ONDANSETRON HCL 4 MG/2ML IJ SOLN
4.0000 mg | Freq: Four times a day (QID) | INTRAMUSCULAR | Status: DC | PRN
Start: 2022-12-13 — End: 2022-12-13

## 2022-12-13 MED ORDER — LIDOCAINE HCL (PF) 1 % IJ SOLN
INTRAMUSCULAR | Status: DC | PRN
  Administered 2022-12-13: 60 mL

## 2022-12-13 MED ORDER — LIDOCAINE HCL 1 % IJ SOLN
INTRAMUSCULAR | Status: AC
  Filled 2022-12-13: qty 60

## 2022-12-13 MED ORDER — SODIUM CHLORIDE 0.9 % IV SOLN: INTRAVENOUS | Status: DC

## 2022-12-13 MED ORDER — CEFAZOLIN SODIUM-DEXTROSE 2-4 GM/100ML-% IV SOLN: 2.0000 g | INTRAVENOUS | Status: AC

## 2022-12-13 MED ORDER — SODIUM CHLORIDE 0.9 % IV SOLN
INTRAVENOUS | Status: AC
  Administered 2022-12-13: 80 mg
  Filled 2022-12-13: qty 2

## 2022-12-13 MED ORDER — MIDAZOLAM HCL 5 MG/5ML IJ SOLN
INTRAMUSCULAR | Status: DC | PRN
  Administered 2022-12-13: 1 mg via INTRAVENOUS

## 2022-12-13 MED ORDER — HEPARIN (PORCINE) IN NACL 1000-0.9 UT/500ML-% IV SOLN
INTRAVENOUS | Status: DC | PRN
  Administered 2022-12-13: 500 mL

## 2022-12-13 SURGICAL SUPPLY — 15 items
CABLE SURGICAL S-101-97-12 (CABLE) ×1 IMPLANT
CATH CPS LOCATOR 3D MED (CATHETERS) IMPLANT
HELIX LOCKING TOOL (MISCELLANEOUS) ×1
KIT MICROPUNCTURE NIT STIFF (SHEATH) IMPLANT
LEAD ULTIPACE 52 LPA1231/52 (Lead) IMPLANT
LEAD ULTIPACE 65 LPA1231/65 (Lead) IMPLANT
LOCATOR PLUS 1281 (MISCELLANEOUS) IMPLANT
PACEMAKER ASSURITY DR-RF (Pacemaker) IMPLANT
PAD DEFIB RADIO PHYSIO CONN (PAD) ×1 IMPLANT
SHEATH 7FR PRELUDE SNAP 13 (SHEATH) IMPLANT
SHEATH 9FR PRELUDE SNAP 13 (SHEATH) IMPLANT
SLITTER AGILIS HISPRO (INSTRUMENTS) IMPLANT
TOOL HELIX LOCKING (MISCELLANEOUS) IMPLANT
TRAY PACEMAKER INSERTION (PACKS) ×1 IMPLANT
WIRE HI TORQ VERSACORE-J 145CM (WIRE) IMPLANT

## 2022-12-13 NOTE — Interval H&P Note (Signed)
History and Physical Interval Note:  12/13/2022 11:50 AM  Steve Miller  has presented today for surgery, with the diagnosis of heart block.  The various methods of treatment have been discussed with the patient and family. After consideration of risks, benefits and other options for treatment, the patient has consented to  Procedure(s): PACEMAKER IMPLANT - DUAL CHAMBER (N/A) as a surgical intervention.  The patient's history has been reviewed, patient examined, no change in status, stable for surgery.  I have reviewed the patient's chart and labs.  Questions were answered to the patient's satisfaction.     Roberts Gaudy Jevon [place]page

## 2022-12-13 NOTE — Progress Notes (Signed)
Discharge instructions done with pt and wife and granddaughter.  All verbalize understanding and deny further questions.  Report given to [facility] who will assume care at this time.

## 2022-12-13 NOTE — Discharge Instructions (Signed)
After Your Pacemaker   You have a Abbott Pacemaker  ACTIVITY Do not lift your arm above shoulder height for 1 week after your procedure. After 7 days, you may progress as below.  You should remove your sling 24 hours after your procedure, unless otherwise instructed by your provider.     Monday December 20, 2022  Tuesday December 21, 2022 Wednesday December 22, 2022 Thursday December 23, 2022   Do not lift, push, pull, or carry anything over 10 pounds with the affected arm until 6 weeks (Monday January 24, 2023 ) after your procedure.   You may drive AFTER your wound check, unless you have been told otherwise by your provider.   Ask your healthcare provider when you can go back to work   INCISION/Dressing If you are on a blood thinner such as Coumadin, Xarelto, Eliquis, Plavix, or Pradaxa please confirm with your provider when this should be resumed.   If large square, outer bandage is left in place, this can be removed after 24 hours from your procedure. Do not remove steri-strips or glue as below.   If a PRESSURE DRESSING (a bulky dressing that usually goes up over your shoulder) was applied or left in place, please follow instructions given by your provider on when to return to have this removed.   Monitor your Pacemaker site for redness, swelling, and drainage. Call the device clinic at [phone] if you experience these symptoms or fever/chills.  If your incision is sealed with Steri-strips or staples, you may shower 7 days after your procedure or when told by your provider. Do not remove the steri-strips or let the shower hit directly on your site. You may wash around your site with soap and water.    If you were discharged in a sling, please do not wear this during the day more than 48 hours after your surgery unless otherwise instructed. This may increase the risk of stiffness and soreness in your shoulder.   Avoid lotions, ointments, or perfumes over your incision until it  is well-healed.  You may use a hot tub or a pool AFTER your wound check appointment if the incision is completely closed.  Pacemaker Alerts:  Some alerts are vibratory and others beep. These are NOT emergencies. Please call our office to let us know. If this occurs at night or on weekends, it can wait until the next business day. Send a remote transmission.  If your device is capable of reading fluid status (for heart failure), you will be offered monthly monitoring to review this with you.   DEVICE MANAGEMENT Remote monitoring is used to monitor your pacemaker from home. This monitoring is scheduled every 91 days by our office. It allows [place] to keep an eye on the functioning of your device to ensure it is working properly. You will routinely see your Electrophysiologist annually (more often if necessary).   You should receive your ID card for your new device in 4-8 weeks. Keep this card with you at all times once received. Consider wearing a medical alert bracelet or necklace.  Your Pacemaker may be MRI compatible. This will be discussed at your next office visit/wound check.  You should avoid contact with strong electric or magnetic fields.   Do not use amateur (ham) radio equipment or electric (arc) welding torches. MP3 player headphones with magnets should not be used. Some devices are safe to use if held at least 12 inches (30 cm) from your Pacemaker. These include power tools, lawn  mowers, and speakers. If you are unsure if something is safe to use, ask your health care provider.  When using your cell phone, hold it to the ear that is on the opposite side from the Pacemaker. Do not leave your cell phone in a pocket over the Pacemaker.  You may safely use electric blankets, heating pads, computers, and microwave ovens.  Call the office right away if: You have chest pain. You feel more short of breath than you have felt before. You feel more light-headed than you have felt before. Your  incision starts to open up.  This information is not intended to replace advice given to you by your health care provider. Make sure you discuss any questions you have with your health care provider.After Your Pacemaker   You have a Abbott Pacemaker  ACTIVITY Do not lift your arm above shoulder height for 1 week after your procedure. After 7 days, you may progress as below.  You should remove your sling 24 hours after your procedure, unless otherwise instructed by your provider.     Monday December 20, 2022  Tuesday December 21, 2022 Wednesday December 22, 2022 Thursday December 23, 2022   Do not lift, push, pull, or carry anything over 10 pounds with the affected arm until 6 weeks (Monday January 24, 2023 ) after your procedure.   You may drive AFTER your wound check, unless you have been told otherwise by your provider.   Ask your healthcare provider when you can go back to work   INCISION/Dressing If you are on a blood thinner such as Coumadin, Xarelto, Eliquis, Plavix, or Pradaxa please confirm with your provider when this should be resumed.   If large square, outer bandage is left in place, this can be removed after 24 hours from your procedure. Do not remove steri-strips or glue as below.   If a PRESSURE DRESSING (a bulky dressing that usually goes up over your shoulder) was applied or left in place, please follow instructions given by your provider on when to return to have this removed.   Monitor your Pacemaker site for redness, swelling, and drainage. Call the device clinic at [phone] if you experience these symptoms or fever/chills.  If your incision is sealed with Steri-strips or staples, you may shower 7 days after your procedure or when told by your provider. Do not remove the steri-strips or let the shower hit directly on your site. You may wash around your site with soap and water.    If you were discharged in a sling, please do not wear this during the day more  than 48 hours after your surgery unless otherwise instructed. This may increase the risk of stiffness and soreness in your shoulder.   Avoid lotions, ointments, or perfumes over your incision until it is well-healed.  You may use a hot tub or a pool AFTER your wound check appointment if the incision is completely closed.  Pacemaker Alerts:  Some alerts are vibratory and others beep. These are NOT emergencies. Please call our office to let us know. If this occurs at night or on weekends, it can wait until the next business day. Send a remote transmission.  If your device is capable of reading fluid status (for heart failure), you will be offered monthly monitoring to review this with you.   DEVICE MANAGEMENT Remote monitoring is used to monitor your pacemaker from home. This monitoring is scheduled every 91 days by our office. It allows [place] to keep an  eye on the functioning of your device to ensure it is working properly. You will routinely see your Electrophysiologist annually (more often if necessary).   You should receive your ID card for your new device in 4-8 weeks. Keep this card with you at all times once received. Consider wearing a medical alert bracelet or necklace.  Your Pacemaker may be MRI compatible. This will be discussed at your next office visit/wound check.  You should avoid contact with strong electric or magnetic fields.   Do not use amateur (ham) radio equipment or electric (arc) welding torches. MP3 player headphones with magnets should not be used. Some devices are safe to use if held at least 12 inches (30 cm) from your Pacemaker. These include power tools, lawn mowers, and speakers. If you are unsure if something is safe to use, ask your health care provider.  When using your cell phone, hold it to the ear that is on the opposite side from the Pacemaker. Do not leave your cell phone in a pocket over the Pacemaker.  You may safely use electric blankets, heating pads,  computers, and microwave ovens.  Call the office right away if: You have chest pain. You feel more short of breath than you have felt before. You feel more light-headed than you have felt before. Your incision starts to open up.  This information is not intended to replace advice given to you by your health care provider. Make sure you discuss any questions you have with your health care provider.

## 2022-12-13 NOTE — Progress Notes (Signed)
Pt returned from CXR.  Dr Nelly Laurence in to assess pt. Pt states he is sore. Tylenol given for pain.  See Pain scores.

## 2022-12-14 ENCOUNTER — Encounter (HOSPITAL_COMMUNITY): Payer: Self-pay | Admitting: Cardiovascular Disease

## 2022-12-14 ENCOUNTER — Telehealth: Payer: Self-pay | Admitting: Cardiovascular Disease

## 2022-12-14 NOTE — Telephone Encounter (Signed)
Patient wants a call back to discuss no driving until after his next office visit.  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

## 2022-12-14 NOTE — Telephone Encounter (Signed)
Spoke with patient and he is aware of provider recommendations of no driving. He verbalized understanding

## 2022-12-14 NOTE — Telephone Encounter (Signed)
Spoke with patient and he would like to know if he can start driving. He States he drives with his right arm and he wont use use left arm at all. I looked through discharge notes and it states do not drive until your wound is checked or other wise stated by provider.   He states he does not want to have to wait that long to start driving. He is a Education officer, environmental.   Please advise

## 2022-12-15 ENCOUNTER — Telehealth: Payer: Self-pay

## 2022-12-15 NOTE — Telephone Encounter (Signed)
Follow-up after same day discharge: Implant date: 12/13/2022 MD: Mealor Device: ppm sjm Location: l chest    Wound check visit: yes 90 day MD follow-up: yes  Remote Transmission received:yes  Dressing/sling removed: n/a  Confirm OAC restart on: n/a  Please continue to monitor your cardiac device site for redness, swelling, and drainage. Call the device clinic at [phone] if you experience these symptoms, fever/chills, or have questions about your device.   Remote monitoring is used to monitor your cardiac device from home. This monitoring is scheduled every 91 days by our office. It allows [place] to keep an eye on the functioning of your device to ensure it is working properly.   LVM for pt to call device clinic back

## 2022-12-16 NOTE — Telephone Encounter (Signed)
Spoke with pt and he is feeling fine. Pt just wanted to know when he can drive and I let him know when he comes in for his wound check they will go over all of that. Pt verbalized understanding

## 2022-12-30 ENCOUNTER — Ambulatory Visit: Payer: No Typology Code available for payment source | Attending: Internal Medicine

## 2022-12-30 DIAGNOSIS — I441 Atrioventricular block, second degree: Secondary | ICD-10-CM

## 2022-12-30 DIAGNOSIS — I495 Sick sinus syndrome: Secondary | ICD-10-CM

## 2022-12-30 NOTE — Patient Instructions (Addendum)
   After Your Pacemaker   Monitor your pacemaker site for redness, swelling, and drainage. Call the device clinic at [phone] if you experience these symptoms or fever/chills.  Your incision was closed with Steri-strips or staples:  You may shower 7 days after your procedure and wash your incision with soap and water. Avoid lotions, ointments, or perfumes over your incision until it is well-healed.  You may use a hot tub or a pool after your wound check appointment if the incision is completely closed.  Do not lift, push or pull greater than 10 pounds with the affected arm until 6 weeks after your procedure. January 24, 2023 There are no other restrictions in arm movement after your wound check appointment.  You may drive, unless driving has been restricted by your healthcare providers.  Remote monitoring is used to monitor your pacemaker from home. This monitoring is scheduled every 91 days by our office. It allows [place] to keep an eye on the functioning of your device to ensure it is working properly. You will routinely see your Electrophysiologist annually (more often if necessary).

## 2022-12-31 LAB — CUP PACEART INCLINIC DEVICE CHECK
Battery Remaining Longevity: 134 mo
Battery Voltage: 3.07 V
Brady Statistic RA Percent Paced: 61 %
Brady Statistic RV Percent Paced: 1.1 %
Date Time Interrogation Session: 20241226144300
Implantable Lead Connection Status: 753985
Implantable Lead Connection Status: 753985
Implantable Lead Implant Date: 20241209
Implantable Lead Implant Date: 20241209
Implantable Lead Location: 753859
Implantable Lead Location: 753860
Implantable Pulse Generator Implant Date: 20241209
Lead Channel Impedance Value: 412.5 Ohm
Lead Channel Impedance Value: 512.5 Ohm
Lead Channel Pacing Threshold Amplitude: 0.75 V
Lead Channel Pacing Threshold Amplitude: 0.75 V
Lead Channel Pacing Threshold Amplitude: 1 V
Lead Channel Pacing Threshold Amplitude: 1 V
Lead Channel Pacing Threshold Pulse Width: 0.5 ms
Lead Channel Pacing Threshold Pulse Width: 0.5 ms
Lead Channel Pacing Threshold Pulse Width: 0.5 ms
Lead Channel Pacing Threshold Pulse Width: 0.5 ms
Lead Channel Sensing Intrinsic Amplitude: 12 mV
Lead Channel Sensing Intrinsic Amplitude: 5 mV
Lead Channel Setting Pacing Amplitude: 0.875
Lead Channel Setting Pacing Amplitude: 2 V
Lead Channel Setting Pacing Pulse Width: 0.5 ms
Lead Channel Setting Sensing Sensitivity: 2 mV
Pulse Gen Model: 2272
Pulse Gen Serial Number: 8232257

## 2022-12-31 NOTE — Progress Notes (Signed)
Wound check appointment. Steri-strips removed. Wound CDI well healed. Normal device function. Device programmed at 3.5V/auto capture programmed on for extra safety margin until 3 month visit. Histogram distribution appropriate for patient and level of activity. No mode switches or high ventricular rates noted. Patient educated about wound care, arm mobility, lifting restrictions. ROV in 3 months with implanting physician.

## 2023-03-24 NOTE — Progress Notes (Unsigned)
  Electrophysiology Office Note:   Date:  03/25/2023  ID:  Steve Miller, DOB Nov 10, 1940, MRN [id number]  Primary Cardiologist: None Primary Heart Failure: None Electrophysiologist: Maurice Small, MD       History of Present Illness:   Steve Miller is a 83 y.o. male with h/o CHB s/p PPM, HTN, polycythemia vera, peripheral neuropathy seen today for routine electrophysiology followup.   Since last being seen in our clinic the patient reports he has been doing well since his device was placed. He notes he gets a little short of breath when walking briskly around [employer]. He states he likes to walk but feels like he will get short of breath occasionally.  Denies ever having chest pain. He thinks he is probably deconditioned. States "I used to be a runner and now I am out of shape".   He denies chest pain, palpitations, dyspnea, PND, orthopnea, nausea, vomiting, dizziness, syncope, edema, weight gain, or early satiety.   Review of systems complete and found to be negative unless listed in HPI.  EP Information / Studies Reviewed:    EKG is not ordered today. EKG from 12/13/2022 reviewed which showed NSR, RBBB, LAFB, 61 bpm  (post procedure)      PPM Interrogation-  reviewed in detail today,  See PACEART report.  Device History: Abbott Dual Chamber PPM implanted 12/13/22 for CHB  Studies:  ECHO 09/2022 > LVEF 61% Zio 10/2022 > SR with rate 38-114bpm, ave 56 bpm, episodes of NSVT, episodes of second-degree Mobitz II heart block & HCB   Arrhythmia / AAD CHB   Risk Assessment/Calculations:              Physical Exam:   VS:  BP 122/60   Pulse 60   Ht 5' 7.5" (1.715 m)   Wt 167 lb 12.8 oz (76.1 kg)   SpO2 97%   BMI 25.89 kg/m    Wt Readings from Last 3 Encounters:  03/25/23 167 lb 12.8 oz (76.1 kg)  12/13/22 165 lb (74.8 kg)  11/29/22 165 lb 12.8 oz (75.2 kg)     GEN: Well nourished, well developed in no acute distress NECK: No JVD; No carotid bruits CARDIAC: Regular rate  and rhythm, no murmurs, rubs, gallops RESPIRATORY:  Clear to auscultation without rales, wheezing or rhonchi  ABDOMEN: Soft, non-tender, non-distended EXTREMITIES:  No edema; No deformity   ASSESSMENT AND PLAN:    CHB s/p Abbott PPM  -Normal PPM function -See Pace Art report -No changes today -unable to see EGM's on device > 5 episodes with no EGM's or markers > programming adjusted per industry to see EGM's.  Review of time shows nothing longer than 2 minutes.  -Programming adjusted  with industry. Changed from DDD to DDIR, slope 8, auto (+0.0 nominal), VIP turned off > PAVD to , SAVD 170 ms -will bring back in one month to reassess shortness of breath   HFrEF  CAD  HLD  -per [place] / primary Cardiology    Disposition:   Follow up with EP APP in 4 weeks  Signed, Canary Brim, NP-C, AGACNP-BC [facility] - Electrophysiology  03/25/2023, 4:50 PM

## 2023-03-25 ENCOUNTER — Ambulatory Visit: Payer: No Typology Code available for payment source | Attending: Cardiovascular Disease | Admitting: Pulmonary Disease

## 2023-03-25 ENCOUNTER — Encounter: Payer: Self-pay | Admitting: Pulmonary Disease

## 2023-03-25 VITALS — BP 122/60 | HR 60 | Ht 67.5 in | Wt 167.8 lb

## 2023-03-25 DIAGNOSIS — I495 Sick sinus syndrome: Secondary | ICD-10-CM

## 2023-03-25 DIAGNOSIS — I442 Atrioventricular block, complete: Secondary | ICD-10-CM

## 2023-03-25 DIAGNOSIS — I441 Atrioventricular block, second degree: Secondary | ICD-10-CM | POA: Diagnosis not present

## 2023-03-25 DIAGNOSIS — Z950 Presence of cardiac pacemaker: Secondary | ICD-10-CM | POA: Diagnosis not present

## 2023-03-25 LAB — CUP PACEART INCLINIC DEVICE CHECK
Battery Remaining Longevity: 117 mo
Battery Voltage: 3.01 V
Brady Statistic RA Percent Paced: 47 %
Brady Statistic RV Percent Paced: 96 %
Date Time Interrogation Session: 20250321165132
Implantable Lead Connection Status: 753985
Implantable Lead Connection Status: 753985
Implantable Lead Implant Date: 20241209
Implantable Lead Implant Date: 20241209
Implantable Lead Location: 753859
Implantable Lead Location: 753860
Implantable Pulse Generator Implant Date: 20241209
Lead Channel Impedance Value: 387.5 Ohm
Lead Channel Impedance Value: 450 Ohm
Lead Channel Pacing Threshold Amplitude: 0.625 V
Lead Channel Pacing Threshold Amplitude: 0.875 V
Lead Channel Pacing Threshold Pulse Width: 0.4 ms
Lead Channel Pacing Threshold Pulse Width: 0.5 ms
Lead Channel Sensing Intrinsic Amplitude: 5 mV
Lead Channel Setting Pacing Amplitude: 0.875
Lead Channel Setting Pacing Amplitude: 1.875
Lead Channel Setting Pacing Pulse Width: 0.5 ms
Lead Channel Setting Sensing Sensitivity: 2 mV
Pulse Gen Model: 2272
Pulse Gen Serial Number: 8232257

## 2023-03-25 NOTE — Patient Instructions (Signed)
 Medication Instructions:  Your physician recommends that you continue on your current medications as directed. Please refer to the Current Medication list given to you today.  *If you need a refill on your cardiac medications before your next appointment, please call your pharmacy*  Lab Work: None ordered If you have labs (blood work) drawn today and your tests are completely normal, you will receive your results only by: MyChart Message (if you have MyChart) OR A paper copy in the mail If you have any lab test that is abnormal or we need to change your treatment, we will call you to review the results.  Follow-Up: At [facility], you and your health needs are our priority.  As part of our continuing mission to provide you with exceptional heart care, we have created designated Provider Care Teams.  These Care Teams include your primary Cardiologist (physician) and Advanced Practice Providers (APPs -  Physician Assistants and Nurse Practitioners) who all work together to provide you with the care you need, when you need it.  We recommend signing up for the patient portal called "MyChart".  Sign up information is provided on this After Visit Summary.  MyChart is used to connect with patients for Virtual Visits (Telemedicine).  Patients are able to view lab/test results, encounter notes, upcoming appointments, etc.  Non-urgent messages can be sent to your provider as well.   To learn more about what you can do with MyChart, go to [email or web address].    Your next appointment:   1 month(s)  Provider:   Canary Brim, NP

## 2023-05-15 NOTE — Progress Notes (Unsigned)
  Electrophysiology Office Note:   Date:  05/18/2023  ID:  Rudell Corrigan, DOB [birth year], MRN [id number]  Primary Cardiologist: None Primary Heart Failure: None Electrophysiologist: Efraim Grange, MD       History of Present Illness:   Steve Miller is a 83 y.o. male with h/o CHB s/p PPM, HTN, polycythemia vera, peripheral neuropathy seen today for routine electrophysiology followup.   Seen 03/25/23 for follow up and reported shortness of breath with walking around [employer] at times.   Since last being seen in our clinic the patient reports he mostly sits at home. He continues to have some periods of shortness of breath intermittently > former remote smoker. He can not tell that the changes made on device last visit made much difference in his intermittent shortness of breath. He denies  chest pressure / pain. Follows at the [place].  He denies chest pain, palpitations, dyspnea, PND, orthopnea, nausea, vomiting, dizziness, syncope, edema, weight gain, or early satiety.   Review of systems complete and found to be negative unless listed in HPI.    EP Information / Studies Reviewed:    EKG is not ordered today. EKG from 12/13/22 reviewed which showed NSR, 61 bpm, RBBB, LAFB      PPM Interrogation-  reviewed in detail today,  See PACEART report.  Device History: Abbott Dual Chamber PPM implanted 12/13/22 for CHB.  Device is MRI conditional.  Dependent at 40 bpm  Studies:  ECHO 09/2022 > LVEF 61% Zio 10/2022 > SR with rate 38-114bpm, ave 56 bpm, episodes of NSVT, episodes of second-degree Mobitz II heart block & CHB        Physical Exam:   VS:  BP 137/63 (BP Location: Left Arm, Patient Position: Sitting, Cuff Size: Normal)   Pulse 60   Resp 16   Ht 5\' 7"  (1.702 m)   Wt 169 lb (76.7 kg)   SpO2 98%   BMI 26.47 kg/m    Wt Readings from Last 3 Encounters:  05/18/23 169 lb (76.7 kg)  03/25/23 167 lb 12.8 oz (76.1 kg)  12/13/22 165 lb (74.8 kg)     GEN: elderly, well nourished, well  developed in no acute distress NECK: No JVD; No carotid bruits CARDIAC: Regular rate and rhythm, no murmurs, rubs, gallops RESPIRATORY:  Clear to auscultation without rales, wheezing or rhonchi  ABDOMEN: Soft, non-tender, non-distended EXTREMITIES:  No edema; No deformity   ASSESSMENT AND PLAN:    CHB s/p Abbott PPM  -Normal PPM function -See Pace Art report -pt does not note any significant difference after prior changes at last visit > Changed from DDD to DDDR, slope 8, auto (+0.0 nominal), VIP turned off > PAVD to , SAVD 170 ms  -no significant episodes on device -dependent at 40 bpm   HFrEF  CAD  HLD  -[facility]  -no anginal symptoms / euvolemic on exam    Shortness of Breath  Intermittent, no significant swelling or chest pain  -reviewed patients activity level and he is largely sedentary > may be element of deconditioning  -encouraged him to follow up at the [place] (prefers due to cost) for review of shortness of breath given device optimized.   Disposition:   Follow up with Dr. Arlester Ladd in 12 months  Signed, Creighton Doffing, NP-C, AGACNP-BC  [facility] - Elect[facility]iology  05/18/2023, 9:58 AM

## 2023-05-17 ENCOUNTER — Telehealth: Payer: Self-pay | Admitting: Cardiovascular Disease

## 2023-05-17 NOTE — Telephone Encounter (Signed)
 Patient called stating he lost his card with his pacemaker information and wants to get the a new card.

## 2023-05-17 NOTE — Telephone Encounter (Signed)
 Attempted to contact patient. No answer, left message to call back.  Patient has [employer] account executive. [phone]

## 2023-05-18 ENCOUNTER — Ambulatory Visit: Attending: Pulmonary Disease | Admitting: Pulmonary Disease

## 2023-05-18 ENCOUNTER — Encounter: Payer: Self-pay | Admitting: Pulmonary Disease

## 2023-05-18 VITALS — BP 137/63 | HR 60 | Resp 16 | Ht 67.0 in | Wt 169.0 lb

## 2023-05-18 DIAGNOSIS — Z950 Presence of cardiac pacemaker: Secondary | ICD-10-CM

## 2023-05-18 DIAGNOSIS — I442 Atrioventricular block, complete: Secondary | ICD-10-CM

## 2023-05-18 LAB — CUP PACEART INCLINIC DEVICE CHECK
Date Time Interrogation Session: 20250514095354
Implantable Lead Connection Status: 753985
Implantable Lead Connection Status: 753985
Implantable Lead Implant Date: 20241209
Implantable Lead Implant Date: 20241209
Implantable Lead Location: 753859
Implantable Lead Location: 753860
Implantable Pulse Generator Implant Date: 20241209
Pulse Gen Model: 2272
Pulse Gen Serial Number: 8232257

## 2023-05-18 NOTE — Telephone Encounter (Signed)
 Patient called and provided [employer] number to order a new card. Was appreciative of assistance.

## 2023-05-18 NOTE — Patient Instructions (Signed)
 Medication Instructions:  Your physician recommends that you continue on your current medications as directed. Please refer to the Current Medication list given to you today.  *If you need a refill on your cardiac medications before your next appointment, please call your pharmacy*  Lab Work: NONE If you have labs (blood work) drawn today and your tests are completely normal, you will receive your results only by: MyChart Message (if you have MyChart) OR A paper copy in the mail If you have any lab test that is abnormal or we need to change your treatment, we will call you to review the results.  Testing/Procedures: NONE  Follow-Up: At [facility], you and your health needs are our priority.  As part of our continuing mission to provide you with exceptional heart care, our providers are all part of one team.  This team includes your primary Cardiologist (physician) and Advanced Practice Providers or APPs (Physician Assistants and Nurse Practitioners) who all work together to provide you with the care you need, when you need it.  Your next appointment:   1 year(s)  Provider:   You may see Efraim Grange, MD or one of the following Advanced Practice Providers on your designated Care Team:   Mertha Abrahams, [place] [address]" [place], PA-C Suzann Riddle, NP Creighton Doffing, NP    We recommend signing up for the patient portal called "MyChart".  Sign up information is provided on this After Visit Summary.  MyChart is used to connect with patients for Virtual Visits (Telemedicine).  Patients are able to view lab/test results, encounter notes, upcoming appointments, etc.  Non-urgent messages can be sent to your provider as well.   To learn more about what you can do with MyChart, go to [email or web address].   Other Instructions

## 2023-05-20 ENCOUNTER — Ambulatory Visit: Payer: Self-pay | Admitting: Cardiovascular Disease
